# Patient Record
Sex: Male | Born: 1965 | Race: White | Hispanic: No | Marital: Married | Smoking: Never smoker
Health system: Southern US, Community
[De-identification: ages and names within clinical notes are randomized; demographics above are authoritative.]

## PROBLEM LIST (undated history)

## (undated) DIAGNOSIS — Z8249 Family history of ischemic heart disease and other diseases of the circulatory system: Secondary | ICD-10-CM

## (undated) DIAGNOSIS — K219 Gastro-esophageal reflux disease without esophagitis: Secondary | ICD-10-CM

## (undated) DIAGNOSIS — I2510 Atherosclerotic heart disease of native coronary artery without angina pectoris: Secondary | ICD-10-CM

## (undated) DIAGNOSIS — E785 Hyperlipidemia, unspecified: Secondary | ICD-10-CM

## (undated) DIAGNOSIS — M79602 Pain in left arm: Secondary | ICD-10-CM

## (undated) DIAGNOSIS — I770 Arteriovenous fistula, acquired: Secondary | ICD-10-CM

## (undated) DIAGNOSIS — R9431 Abnormal electrocardiogram [ECG] [EKG]: Secondary | ICD-10-CM

## (undated) HISTORY — DX: Abnormal electrocardiogram (ECG) (EKG): R94.31

## (undated) HISTORY — DX: Family history of ischemic heart disease and other diseases of the circulatory system: Z82.49

## (undated) HISTORY — PX: ESOPHAGEAL DILATION: SHX303

## (undated) HISTORY — DX: Hyperlipidemia, unspecified: E78.5

## (undated) HISTORY — DX: Pain in left arm: M79.602

---

## 2005-08-19 ENCOUNTER — Encounter: Admission: RE | Admit: 2005-08-19 | Discharge: 2005-08-19 | Payer: Self-pay | Admitting: Internal Medicine

## 2011-10-20 ENCOUNTER — Other Ambulatory Visit: Payer: Self-pay | Admitting: Gastroenterology

## 2011-10-20 ENCOUNTER — Ambulatory Visit
Admission: RE | Admit: 2011-10-20 | Discharge: 2011-10-20 | Disposition: A | Discharge status: Home / Self Care | Payer: BC Managed Care – PPO | Source: Ambulatory Visit | Attending: Gastroenterology | Admitting: Gastroenterology

## 2011-10-20 DIAGNOSIS — R1310 Dysphagia, unspecified: Secondary | ICD-10-CM

## 2013-06-29 ENCOUNTER — Ambulatory Visit (INDEPENDENT_AMBULATORY_CARE_PROVIDER_SITE_OTHER): Payer: BC Managed Care – PPO | Admitting: Cardiovascular Disease

## 2013-06-29 ENCOUNTER — Encounter (HOSPITAL_COMMUNITY): Payer: Self-pay | Admitting: Pharmacy Technician

## 2013-06-29 ENCOUNTER — Encounter: Payer: Self-pay | Admitting: Cardiovascular Disease

## 2013-06-29 VITALS — BP 120/90 | Ht 73.0 in | Wt 237.7 lb

## 2013-06-29 DIAGNOSIS — R9431 Abnormal electrocardiogram [ECG] [EKG]: Secondary | ICD-10-CM

## 2013-06-29 DIAGNOSIS — Z79899 Other long term (current) drug therapy: Secondary | ICD-10-CM

## 2013-06-29 DIAGNOSIS — R5383 Other fatigue: Secondary | ICD-10-CM

## 2013-06-29 DIAGNOSIS — D689 Coagulation defect, unspecified: Secondary | ICD-10-CM

## 2013-06-29 DIAGNOSIS — R0789 Other chest pain: Secondary | ICD-10-CM

## 2013-06-29 DIAGNOSIS — R5381 Other malaise: Secondary | ICD-10-CM

## 2013-06-29 DIAGNOSIS — M79602 Pain in left arm: Secondary | ICD-10-CM | POA: Insufficient documentation

## 2013-06-29 DIAGNOSIS — Z01818 Encounter for other preprocedural examination: Secondary | ICD-10-CM

## 2013-06-29 NOTE — Patient Instructions (Signed)
Your physician has requested that you have a cardiac catheterization (radial) next week. Cardiac catheterization is used to diagnose and/or treat various heart conditions. Doctors may recommend this procedure for a number of different reasons. The most common reason is to evaluate chest pain. Chest pain can be a symptom of coronary artery disease (CAD), and cardiac catheterization can show whether plaque is narrowing or blocking your heart's arteries. This procedure is also used to evaluate the valves, as well as measure the blood flow and oxygen levels in different parts of your heart. For further information please visit [email or web address].   Following your catheterization, you will not be allowed to drive for 3 days.  No lifting, pushing, or pulling greater that 10 pounds is allowed for 1 week.  When the procedure is scheduled, you will be given a date to have bloodwork and a chest xray done.

## 2013-06-29 NOTE — Assessment & Plan Note (Signed)
The patient has developed exertional left upper extremity pain over the last month. It is fairly reproducible with exercise. He does have a strong family history of heart disease with a father who died of an MI at 6459, uncle and paternal grandfather all of who had heart disease. EKG shows Q waves V1 through V4 suggesting an anteroseptal MI. He is on low-dose aspirin. Abdomen performed diagnostic coronary arteriography on him via the right radial approach on Monday. I thoroughly explained the risks and benefits.

## 2013-06-29 NOTE — Progress Notes (Signed)
06/29/2013 [name]   07/06/1965  [id number]  Primary Physician Pearla DubonnetGATES,ROBERT NEVILL, MD Primary Cardiologist: Runell GessJonathan J. Blue Winther MD Roseanne RenoFACP,FACC,FAHA, FSCAI   HPI:  Mr. 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 He was referred by Dr. Johnella MoloneyNeville Gates for cardiovascular evaluation because of exertional left upper extremity pain and an abnormal EKG. Factors include a strong family history of heart disease the father died of an MI 4959, maternal uncle and grandfather all had heart disease. She's never had artificial. His only risk factor is family history. 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 The pain is reproducible and is without exertional. His EKG shows Q waves V1 through V4 suggesting anteroseptal myocardial function.   Current Outpatient Prescriptions  Medication Sig Dispense Refill  . aspirin 81 MG tablet Take 81 mg by mouth daily.      Marland Kitchen. omeprazole (PRILOSEC) 20 MG capsule Take 20 mg by mouth daily.      . ranitidine (ZANTAC) 150 MG tablet Take 150 mg by mouth at bedtime.       No current facility-administered medications for this visit.    Not on File  History   Social History  . Marital Status: Married    Spouse Name: N/A    Number of Children: N/A  . Years of Education: N/A   Occupational History  . Not on file.   Social History Main Topics  . Smoking status: Never Smoker   . Smokeless tobacco: Not on file  . Alcohol Use: Not on file  . Drug Use: Not on file  . Sexual Activity: Not on file   Other Topics Concern  . Not on file   Social History Narrative  . No narrative on file     Review of Systems: General: negative for chills, fever, night sweats or weight changes.  Cardiovascular: negative for chest pain, dyspnea on exertion, edema, orthopnea, palpitations, paroxysmal nocturnal dyspnea or shortness of breath Dermatological: negative  for rash Respiratory: negative for cough or wheezing Urologic: negative for hematuria Abdominal: negative for nausea, vomiting, diarrhea, bright red blood per rectum, melena, or hematemesis Neurologic: negative for visual changes, syncope, or dizziness All other systems reviewed and are otherwise negative except as noted above.    Blood pressure 120/90, height 6\' 1"  (1.854 m), weight 107.82 kg (237 lb 11.2 oz).  General appearance: alert and no distress Neck: no adenopathy, no carotid bruit, no JVD, supple, symmetrical, trachea midline and thyroid not enlarged, symmetric, no tenderness/mass/nodules Lungs: clear to auscultation bilaterally Heart: regular rate and rhythm, S1, S2 normal, no murmur, click, rub or gallop Abdomen: soft, non-tender; bowel sounds normal; no masses,  no organomegaly Extremities: extremities normal, atraumatic, no cyanosis or edema and venous stasis dermatitis noted  EKG normal sinus rhythm at 78 with septal Q waves. There are also small Q waves involving leads II, III, and F  ASSESSMENT AND PLAN:   Pain of left upper extremity The patient has developed exertional left upper extremity pain over the last month. It is fairly reproducible with exercise. He does have a strong family history of heart disease with a father who died of an MI at 4959, uncle and paternal grandfather all of who had heart disease. EKG shows Q waves V1 through V4 suggesting an anteroseptal MI. He is on low-dose aspirin. Abdomen performed diagnostic coronary arteriography on him via the right radial approach  on Monday. I thoroughly explained the risks and benefits.      Runell Gess MD FACP,FACC,FAHA, [facility] 06/29/2013 4:05 PM

## 2013-06-30 ENCOUNTER — Ambulatory Visit
Admission: RE | Admit: 2013-06-30 | Discharge: 2013-06-30 | Disposition: A | Discharge status: Home / Self Care | Payer: BC Managed Care – PPO | Source: Ambulatory Visit | Attending: Cardiovascular Disease | Admitting: Cardiovascular Disease

## 2013-06-30 ENCOUNTER — Telehealth: Payer: Self-pay | Admitting: Cardiovascular Disease

## 2013-06-30 DIAGNOSIS — R0789 Other chest pain: Secondary | ICD-10-CM

## 2013-06-30 LAB — BASIC METABOLIC PANEL
BUN: 15 mg/dL (ref 6–23)
CALCIUM: 9.8 mg/dL (ref 8.4–10.5)
CO2: 29 mEq/L (ref 19–32)
Chloride: 104 mEq/L (ref 96–112)
Creat: 1.01 mg/dL (ref 0.50–1.35)
GLUCOSE: 88 mg/dL (ref 70–99)
Potassium: 4.6 mEq/L (ref 3.5–5.3)
Sodium: 141 mEq/L (ref 135–145)

## 2013-06-30 LAB — CBC
HCT: 47.4 % (ref 39.0–52.0)
Hemoglobin: 16.9 g/dL (ref 13.0–17.0)
MCH: 30.2 pg (ref 26.0–34.0)
MCHC: 35.7 g/dL (ref 30.0–36.0)
MCV: 84.6 fL (ref 78.0–100.0)
Platelets: 157 10*3/uL (ref 150–400)
RBC: 5.6 MIL/uL (ref 4.22–5.81)
RDW: 14.7 % (ref 11.5–15.5)
WBC: 4.8 10*3/uL (ref 4.0–10.5)

## 2013-06-30 NOTE — Telephone Encounter (Signed)
Is asking for a note to take to the court to show that he is having a procedure done and needs to postpone his jury duty will come by to pick it note today  .Marland Kitchen. Please call at [phone] .Marland Kitchen. Can also leave msg with his wife .Marland Kitchen.   Thanks

## 2013-06-30 NOTE — Telephone Encounter (Signed)
Letter provided to patient

## 2013-06-30 NOTE — Telephone Encounter (Signed)
Message forwarded to Kathryn, RN to discuss w/ Dr. Berry.  

## 2013-07-01 LAB — PROTIME-INR
INR: 0.95 (ref <1.50)
PROTHROMBIN TIME: 12.6 s (ref 11.6–15.2)

## 2013-07-01 LAB — APTT: aPTT: 32 seconds (ref 24–37)

## 2013-07-01 LAB — TSH: TSH: 3.22 u[IU]/mL (ref 0.350–4.500)

## 2013-07-03 ENCOUNTER — Ambulatory Visit (HOSPITAL_COMMUNITY)
Admission: RE | Admit: 2013-07-03 | Discharge: 2013-07-04 | Disposition: A | Discharge status: Home / Self Care | Payer: BC Managed Care – PPO | Source: Ambulatory Visit | Attending: Cardiovascular Disease | Admitting: Cardiovascular Disease

## 2013-07-03 ENCOUNTER — Encounter (HOSPITAL_COMMUNITY): Payer: Self-pay | Admitting: General Practice

## 2013-07-03 ENCOUNTER — Encounter (HOSPITAL_COMMUNITY)
Admission: RE | Disposition: A | Discharge status: Home / Self Care | Payer: BC Managed Care – PPO | Source: Ambulatory Visit | Attending: Cardiovascular Disease

## 2013-07-03 DIAGNOSIS — R0989 Other specified symptoms and signs involving the circulatory and respiratory systems: Secondary | ICD-10-CM

## 2013-07-03 DIAGNOSIS — E663 Overweight: Secondary | ICD-10-CM | POA: Insufficient documentation

## 2013-07-03 DIAGNOSIS — I770 Arteriovenous fistula, acquired: Secondary | ICD-10-CM

## 2013-07-03 DIAGNOSIS — Y840 Cardiac catheterization as the cause of abnormal reaction of the patient, or of later complication, without mention of misadventure at the time of the procedure: Secondary | ICD-10-CM | POA: Insufficient documentation

## 2013-07-03 DIAGNOSIS — I200 Unstable angina: Secondary | ICD-10-CM | POA: Insufficient documentation

## 2013-07-03 DIAGNOSIS — Z01818 Encounter for other preprocedural examination: Secondary | ICD-10-CM

## 2013-07-03 DIAGNOSIS — Z8249 Family history of ischemic heart disease and other diseases of the circulatory system: Secondary | ICD-10-CM | POA: Insufficient documentation

## 2013-07-03 DIAGNOSIS — Z955 Presence of coronary angioplasty implant and graft: Secondary | ICD-10-CM

## 2013-07-03 DIAGNOSIS — S55109A Unspecified injury of radial artery at forearm level, unspecified arm, initial encounter: Secondary | ICD-10-CM | POA: Insufficient documentation

## 2013-07-03 DIAGNOSIS — Z7982 Long term (current) use of aspirin: Secondary | ICD-10-CM | POA: Insufficient documentation

## 2013-07-03 DIAGNOSIS — I2510 Atherosclerotic heart disease of native coronary artery without angina pectoris: Secondary | ICD-10-CM | POA: Insufficient documentation

## 2013-07-03 DIAGNOSIS — M79602 Pain in left arm: Secondary | ICD-10-CM

## 2013-07-03 HISTORY — DX: Arteriovenous fistula, acquired: I77.0

## 2013-07-03 HISTORY — PX: CORONARY ANGIOPLASTY: SHX604

## 2013-07-03 HISTORY — DX: Atherosclerotic heart disease of native coronary artery without angina pectoris: I25.10

## 2013-07-03 HISTORY — PX: LEFT HEART CATHETERIZATION WITH CORONARY ANGIOGRAM: SHX5451

## 2013-07-03 HISTORY — DX: Gastro-esophageal reflux disease without esophagitis: K21.9

## 2013-07-03 LAB — POCT ACTIVATED CLOTTING TIME: Activated Clotting Time: 481 seconds

## 2013-07-03 SURGERY — LEFT HEART CATHETERIZATION WITH CORONARY ANGIOGRAM
Anesthesia: LOCAL

## 2013-07-03 MED ORDER — TICAGRELOR 90 MG PO TABS
ORAL_TABLET | ORAL | Status: AC
  Filled 2013-07-03: qty 1

## 2013-07-03 MED ORDER — ASPIRIN 81 MG PO CHEW
81.0000 mg | CHEWABLE_TABLET | Freq: Every day | ORAL | Status: DC
Start: 2013-07-03 — End: 2013-07-04
  Administered 2013-07-04: 81 mg via ORAL
  Filled 2013-07-03: qty 1

## 2013-07-03 MED ORDER — DIAZEPAM 5 MG PO TABS
5.0000 mg | ORAL_TABLET | ORAL | Status: AC
  Administered 2013-07-03: 5 mg via ORAL
  Filled 2013-07-03: qty 1

## 2013-07-03 MED ORDER — SODIUM CHLORIDE 0.9 % IV SOLN
INTRAVENOUS | Status: DC
  Administered 2013-07-03: 09:00:00 via INTRAVENOUS

## 2013-07-03 MED ORDER — SODIUM CHLORIDE 0.9 % IJ SOLN: 3.0000 mL | INTRAMUSCULAR | Status: DC | PRN

## 2013-07-03 MED ORDER — MORPHINE SULFATE 2 MG/ML IJ SOLN: 2.0000 mg | INTRAMUSCULAR | Status: DC | PRN

## 2013-07-03 MED ORDER — NITROGLYCERIN 0.2 MG/ML ON CALL CATH LAB
INTRAVENOUS | Status: AC
  Filled 2013-07-03: qty 1

## 2013-07-03 MED ORDER — ASPIRIN 81 MG PO CHEW: 81.0000 mg | CHEWABLE_TABLET | ORAL | Status: DC

## 2013-07-03 MED ORDER — SODIUM CHLORIDE 0.9 % IV SOLN
0.2500 mg/kg/h | INTRAVENOUS | Status: AC
  Filled 2013-07-03: qty 250

## 2013-07-03 MED ORDER — ASPIRIN 81 MG PO TABS: 81.0000 mg | ORAL_TABLET | Freq: Every day | ORAL | Status: DC

## 2013-07-03 MED ORDER — PANTOPRAZOLE SODIUM 40 MG PO TBEC
40.0000 mg | DELAYED_RELEASE_TABLET | Freq: Every day | ORAL | Status: DC
  Administered 2013-07-04: 40 mg via ORAL
  Filled 2013-07-03: qty 1

## 2013-07-03 MED ORDER — TICAGRELOR 90 MG PO TABS
90.0000 mg | ORAL_TABLET | Freq: Two times a day (BID) | ORAL | Status: DC
  Administered 2013-07-03 – 2013-07-04 (×2): 90 mg via ORAL
  Filled 2013-07-03 (×4): qty 1

## 2013-07-03 MED ORDER — ATORVASTATIN CALCIUM 80 MG PO TABS
80.0000 mg | ORAL_TABLET | Freq: Every day | ORAL | Status: DC
  Administered 2013-07-03: 80 mg via ORAL
  Filled 2013-07-03 (×3): qty 1

## 2013-07-03 MED ORDER — LIDOCAINE HCL (PF) 1 % IJ SOLN
INTRAMUSCULAR | Status: AC
  Filled 2013-07-03: qty 30

## 2013-07-03 MED ORDER — HEPARIN (PORCINE) IN NACL 2-0.9 UNIT/ML-% IJ SOLN
INTRAMUSCULAR | Status: AC
  Filled 2013-07-03: qty 1000

## 2013-07-03 MED ORDER — HYDROCODONE-ACETAMINOPHEN 5-325 MG PO TABS
1.0000 | ORAL_TABLET | Freq: Four times a day (QID) | ORAL | Status: DC | PRN
  Administered 2013-07-03 – 2013-07-04 (×2): 1 via ORAL
  Filled 2013-07-03 (×2): qty 1

## 2013-07-03 MED ORDER — SODIUM CHLORIDE 0.9 % IV SOLN
1.7500 mg/kg/h | INTRAVENOUS | Status: DC
  Administered 2013-07-03: 1.75 mg/kg/h via INTRAVENOUS
  Filled 2013-07-03: qty 250

## 2013-07-03 MED ORDER — HEPARIN SODIUM (PORCINE) 1000 UNIT/ML IJ SOLN
INTRAMUSCULAR | Status: AC
  Filled 2013-07-03: qty 1

## 2013-07-03 MED ORDER — VERAPAMIL HCL 2.5 MG/ML IV SOLN
INTRAVENOUS | Status: AC
  Filled 2013-07-03: qty 2

## 2013-07-03 MED ORDER — SODIUM CHLORIDE 0.9 % IV SOLN: INTRAVENOUS | Status: AC

## 2013-07-03 MED ORDER — BIVALIRUDIN 250 MG IV SOLR
INTRAVENOUS | Status: AC
  Filled 2013-07-03: qty 250

## 2013-07-03 NOTE — Interval H&P Note (Signed)
Cath Lab Visit (complete for each Cath Lab visit)  Clinical Evaluation Leading to the Procedure:   ACS: no  Non-ACS:    Anginal Classification: CCS III  Anti-ischemic medical therapy: No Therapy  Non-Invasive Test Results: No non-invasive testing performed  Prior CABG: No previous CABG      History and Physical Interval Note:  07/03/2013 10:22 AM  Joseph Datesussell Cortese Jr.  has presented today for surgery, with the diagnosis of arm pain  The various methods of treatment have been discussed with the patient and family. After consideration of risks, benefits and other options for treatment, the patient has consented to  Procedure(s): LEFT HEART CATHETERIZATION WITH CORONARY ANGIOGRAM (N/A) as a surgical intervention .  The patient's history has been reviewed, patient examined, no change in status, stable for surgery.  I have reviewed the patient's chart and labs.  Questions were answered to the patient's satisfaction.     Runell GessBERRY,Joseph Valencia

## 2013-07-03 NOTE — H&P (View-Only) (Signed)
06/29/2013 [name]   07/06/1965  [id number]  Primary Physician Pearla DubonnetGATES,ROBERT NEVILL, MD Primary Cardiologist: Runell GessJonathan J. Vesper Trant MD Roseanne RenoFACP,FACC,FAHA, FSCAI   HPI:  Mr. 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 He was referred by Dr. Johnella MoloneyNeville Gates for cardiovascular evaluation because of exertional left upper extremity pain and an abnormal EKG. Factors include a strong family history of heart disease the father died of an MI 2959, maternal uncle and grandfather all had heart disease. She's never had artificial. His only risk factor is family history. 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 The pain is reproducible and is without exertional. His EKG shows Q waves V1 through V4 suggesting anteroseptal myocardial function.   Current Outpatient Prescriptions  Medication Sig Dispense Refill  . aspirin 81 MG tablet Take 81 mg by mouth daily.      Marland Kitchen. omeprazole (PRILOSEC) 20 MG capsule Take 20 mg by mouth daily.      . ranitidine (ZANTAC) 150 MG tablet Take 150 mg by mouth at bedtime.       No current facility-administered medications for this visit.    Not on File  History   Social History  . Marital Status: Married    Spouse Name: N/A    Number of Children: N/A  . Years of Education: N/A   Occupational History  . Not on file.   Social History Main Topics  . Smoking status: Never Smoker   . Smokeless tobacco: Not on file  . Alcohol Use: Not on file  . Drug Use: Not on file  . Sexual Activity: Not on file   Other Topics Concern  . Not on file   Social History Narrative  . No narrative on file     Review of Systems: General: negative for chills, fever, night sweats or weight changes.  Cardiovascular: negative for chest pain, dyspnea on exertion, edema, orthopnea, palpitations, paroxysmal nocturnal dyspnea or shortness of breath Dermatological: negative  for rash Respiratory: negative for cough or wheezing Urologic: negative for hematuria Abdominal: negative for nausea, vomiting, diarrhea, bright red blood per rectum, melena, or hematemesis Neurologic: negative for visual changes, syncope, or dizziness All other systems reviewed and are otherwise negative except as noted above.    Blood pressure 120/90, height 6\' 1"  (1.854 m), weight 107.82 kg (237 lb 11.2 oz).  General appearance: alert and no distress Neck: no adenopathy, no carotid bruit, no JVD, supple, symmetrical, trachea midline and thyroid not enlarged, symmetric, no tenderness/mass/nodules Lungs: clear to auscultation bilaterally Heart: regular rate and rhythm, S1, S2 normal, no murmur, click, rub or gallop Abdomen: soft, non-tender; bowel sounds normal; no masses,  no organomegaly Extremities: extremities normal, atraumatic, no cyanosis or edema and venous stasis dermatitis noted  EKG normal sinus rhythm at 78 with septal Q waves. There are also small Q waves involving leads II, III, and F  ASSESSMENT AND PLAN:   Pain of left upper extremity The patient has developed exertional left upper extremity pain over the last month. It is fairly reproducible with exercise. He does have a strong family history of heart disease with a father who died of an MI at 49,  uncle and paternal grandfather all of who had heart disease. EKG shows Q waves V1 through V4 suggesting an anteroseptal MI. He is on low-dose aspirin. Abdomen performed diagnostic coronary arteriography on him via the right radial approach  on Monday. I thoroughly explained the risks and benefits.      Runell Gess MD FACP,FACC,FAHA, [facility] 06/29/2013 4:05 PM

## 2013-07-03 NOTE — CV Procedure (Signed)
Joseph DatesRussell Balling Jr. is a 48 y.o. male    [id number] LOCATION:  FACILITY: [facility]  PHYSICIAN: Nanetta BattyJonathan Lauri Till, M.D. 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 He was referred by Dr. Johnella MoloneyNeville Gates for cardiovascular evaluation because of exertional left upper extremity pain and an abnormal EKG. Factors include a strong family history of heart disease the father died of an MI 5559, maternal uncle and grandfather all had heart disease. She's never had artificial. His only risk factor is family history. 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 The pain is reproducible and is without exertional. His EKG shows Q waves V1 through V4 suggesting anteroseptal myocardial function.    PROCEDURE DESCRIPTION:   The patient was brought to the second floor [facility] cath lab in the postabsorptive state. He was premedicated with Valium 5 mg by mouth. His right wristwas prepped and shaved in usual sterile fashion. Xylocaine 1% was used for local anesthesia. A 6 French sheath was inserted into the right radial artery using standard Seldinger technique.the patient received 5000 units of heparin intravenously. 5 [place]French TIG catheter and pigtail catheter were used for selective coronary angiography and left ventriculography respectively. Visipaque dye was used for the entire TE (210 cc administered to the patient). Retrograde aorta, left ventricular and pulmonary pressures were recorded.   HEMODYNAMICS:    AO SYSTOLIC/AO DIASTOLIC: 106/74   LV SYSTOLIC/LV DIASTOLIC: 114/0  ANGIOGRAPHIC RESULTS:   1. Left main; normal  2. LAD; subtotal occlusion of the proximal LAD after the first moderate-sized diagonal branch. The LAD filled via grade 2 collaterals of the  dominant RCA. First diagonal branch had an 80% proximal stenosis 3. Left circumflex; nondominant and free of significant disease.  4. Right coronary artery; comment with grade 2 collaterals right to left filling the LAD via septal perforators 5. Left ventriculography; RAO left ventriculogram was performed using  25 mL of Visipaque dye at 12 mL/second. The overall LVEF estimated  60 %  Without wall motion abnormalities  IMPRESSION:Joseph Valencia has a subtotally occluded large LAD that fills via grade 2 collaterals. This is responsible for his symptoms with EKG changes. Likely he has no wall motion normalities and he has preserved LV function. He also has a high-grade diagonal branch stenosis a moderate sized vessel. We'll proceed with attempt at PCI and stenting of his LAD plus or minus diagonal branch using drug-eluting stent, Angiomax, and Brilenta.   Procedure description: patient received 180 mg of by mouth Brilenta. Angiomax bolus was given with an ACT of 481. Using a 6 [place]French XB LAD 4 cm curved guiding catheter along with an 014 190 cm length pro water guidewire and a 2 mm x 12 mm long [employer]  predilatation was performed of the LAD. Following this a 3 mm x 16 mm long Promus drug-eluting stent was then deployed at 16 atmospheres and postdilated with a 3.25 x 12 mm long noncompliant balloon at 16 atmospheres (321 mm) resulting in reduction of a subtotally occluded vessel to 0% residual. There was TIMI-3 flow. The wire was then redirected down the first diagonal branch and predilatation was performed with the same previously used predilatation balloon. Stenting was then performed with a 2.25 mm x 12 mm long Xience  drug-eluting stent the stent at 18 atmospheres (2.46 mm) resulting in reduction of an 80% stenosis to 0% residual. The guide catheter was then removed. The sheath was removed and a T-R band was placed on the right wrist chief patent hemostasis. Angiomax was reduced to 0.25 mg per kilogram per  minute for 4 hours.  Final impression: Successful PCI and stenting of a subtotally occluded LAD and high-grade diagonal branch stenosis using drug-eluting stents. The patient will be to dual antiplatelet therapy including aspirin and Brilenta. He'll be discharged home in the morning. I will see him back in the office in one to 2 weeks.  Joseph Gess MD, [facility] 07/03/2013 11:51 AM

## 2013-07-03 NOTE — Progress Notes (Signed)
TR BAND REMOVAL  LOCATION:    right radial  DEFLATED PER PROTOCOL:    yes  TIME BAND OFF / DRESSING APPLIED:    1915   SITE UPON ARRIVAL:    Level 0  SITE AFTER BAND REMOVAL:    Level 0  REVERSE ALLEN'S TEST:     positive  CIRCULATION SENSATION AND MOVEMENT:    Within Normal Limits   yes  COMMENTS:   Tolerated procedure well

## 2013-07-04 ENCOUNTER — Other Ambulatory Visit: Payer: Self-pay | Admitting: Nurse Practitioner

## 2013-07-04 ENCOUNTER — Encounter (HOSPITAL_COMMUNITY): Payer: Self-pay | Admitting: Nurse Practitioner

## 2013-07-04 ENCOUNTER — Encounter: Payer: Self-pay | Admitting: *Deleted

## 2013-07-04 DIAGNOSIS — R0989 Other specified symptoms and signs involving the circulatory and respiratory systems: Secondary | ICD-10-CM

## 2013-07-04 DIAGNOSIS — T82898A Other specified complication of vascular prosthetic devices, implants and grafts, initial encounter: Secondary | ICD-10-CM

## 2013-07-04 DIAGNOSIS — I200 Unstable angina: Secondary | ICD-10-CM

## 2013-07-04 DIAGNOSIS — N186 End stage renal disease: Secondary | ICD-10-CM

## 2013-07-04 DIAGNOSIS — I770 Arteriovenous fistula, acquired: Secondary | ICD-10-CM

## 2013-07-04 LAB — BASIC METABOLIC PANEL
BUN: 14 mg/dL (ref 6–23)
CALCIUM: 8.9 mg/dL (ref 8.4–10.5)
CO2: 26 mEq/L (ref 19–32)
Chloride: 105 mEq/L (ref 96–112)
Creatinine, Ser: 1 mg/dL (ref 0.50–1.35)
GFR, EST NON AFRICAN AMERICAN: 87 mL/min — AB (ref >90)
GLUCOSE: 104 mg/dL — AB (ref 70–99)
POTASSIUM: 4.3 meq/L (ref 3.7–5.3)
Sodium: 143 mEq/L (ref 137–147)

## 2013-07-04 LAB — CBC
HEMATOCRIT: 42.8 % (ref 39.0–52.0)
Hemoglobin: 16.1 g/dL (ref 13.0–17.0)
MCH: 31.2 pg (ref 26.0–34.0)
MCHC: 37.6 g/dL — AB (ref 30.0–36.0)
MCV: 82.9 fL (ref 78.0–100.0)
Platelets: 130 10*3/uL — ABNORMAL LOW (ref 150–400)
RBC: 5.16 MIL/uL (ref 4.22–5.81)
RDW: 14.5 % (ref 11.5–15.5)
WBC: 5.8 10*3/uL (ref 4.0–10.5)

## 2013-07-04 MED ORDER — TICAGRELOR 90 MG PO TABS
90.0000 mg | ORAL_TABLET | Freq: Two times a day (BID) | ORAL | Status: DC
Start: 2013-07-04 — End: 2013-07-04

## 2013-07-04 MED ORDER — ATORVASTATIN CALCIUM 80 MG PO TABS: 80.0000 mg | ORAL_TABLET | Freq: Every day | ORAL | Status: DC

## 2013-07-04 MED ORDER — NITROGLYCERIN 0.4 MG SL SUBL: 0.4000 mg | SUBLINGUAL_TABLET | SUBLINGUAL | Status: AC | PRN

## 2013-07-04 MED ORDER — TICAGRELOR 90 MG PO TABS: 90.0000 mg | ORAL_TABLET | Freq: Two times a day (BID) | ORAL | Status: DC

## 2013-07-04 MED FILL — Sodium Chloride IV Soln 0.9%: INTRAVENOUS | Qty: 50 | Status: AC

## 2013-07-04 NOTE — Discharge Instructions (Signed)

## 2013-07-04 NOTE — Consult Note (Signed)
[facility] [place]  Patient name: Joseph DatesRussell Patricelli Jr. 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 is a 48 y.o. male who was admitted with a possible anteroseptal myocardial infarction. He underwent cardiac catheterization yesterday via a right radial artery approach. He had successful PTCA of the LAD. This was done with a drug-eluting stent. He was essentially ready for discharge today however on examination he was noted to have a radial bruit. 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 This reason vascular surgery was consult. He denies any pain or paresthesias in his right hand or wrist. He is right-handed. He denies any chest pain or chest pressure.  Past Medical History  Diagnosis Date  . Family history of heart disease   . Pain of left upper extremity   . Abnormal EKG   . Coronary artery disease   . GERD (gastroesophageal reflux disease)    History reviewed. No pertinent family history.  SOCIAL HISTORY: History  Substance Use Topics  . Smoking status: Never Smoker   . Smokeless tobacco: Never Used  . Alcohol Use: Yes     Comment: OCCASIONAL WINE   No Known Allergies  Current Facility-Administered Medications  Medication Dose Route Frequency Provider Last Rate Last Dose  . aspirin chewable tablet 81 mg  81 mg Oral Daily Runell GessJonathan J Berry, MD   81 mg at 07/04/13 0948  . atorvastatin (LIPITOR) tablet 80 mg  80 mg Oral q1800 Runell GessJonathan J Berry, MD   80 mg at 07/03/13 1843  . HYDROcodone-acetaminophen (NORCO/VICODIN) 5-325 MG per tablet 1 tablet  1 tablet Oral Q6H PRN Laurann Montanaayna N Dunn, PA-C   1 tablet at 07/04/13 0346  . morphine 2 MG/ML injection 2 mg  2 mg Intravenous Q1H PRN Runell GessJonathan J Berry, MD      . pantoprazole (PROTONIX) EC tablet 40 mg  40 mg Oral Daily Runell GessJonathan J Berry, MD   40 mg at 07/04/13 0948  . Ticagrelor  (BRILINTA) tablet 90 mg  90 mg Oral BID Runell GessJonathan J Berry, MD   90 mg at 07/04/13 [id number]   REVIEW OF SYSTEMS: Arly.Keller[X ] denotes positive finding; [  ] denotes negative finding CARDIOVASCULAR:  [ ]  chest pain   Arly.Keller[X ] chest pressure on admission (left arm)  [ ]  palpitations   [ ]  orthopnea   [ ]  dyspnea on exertion   [ ]  claudication   [ ]  rest pain   [ ]  DVT   [ ]  phlebitis PULMONARY:   [ ]  productive cough   [ ]  asthma   [ ]  wheezing NEUROLOGIC:   [ ]  weakness  [ ]  paresthesias  [ ]  aphasia  [ ]  amaurosis  [ ]  dizziness HEMATOLOGIC:   [ ]  bleeding problems   [ ]  clotting disorders INTEGUMENTARY:  [ ]  rashes  [ ]  ulcers CONSTITUTIONAL:  [ ]  fever   [ ]  chills  PHYSICAL EXAM: Filed Vitals:   07/03/13 2001 07/04/13 0015 07/04/13 0348 07/04/13 0841  BP: 119/72 118/78 138/82 111/80  Pulse: 92 81 84 82  Temp: 97.4 F (36.3 C) 97.5 F (36.4 C) 97.3 F (36.3 C) 97.9 F (36.6 C)  TempSrc: Oral Oral Oral Oral  Resp: 17 18 17 18   Height:      Weight:  231 lb 14.8 oz (105.2 kg)    SpO2: 99% 96% 96%  95%   Body mass index is 30.61 kg/(m^2). GENERAL: The patient is a well-nourished male, in no acute distress. The vital signs are documented above. CARDIOVASCULAR: There is a regular rate and rhythm. I do not detect carotid bruits. He has a palpable right radial pulse. There is a bruit in the right wrist. He has a palpable left radial pulse. The right hand is warm and well-perfused. PULMONARY: There is good air exchange bilaterally without wheezing or rales. ABDOMEN: Soft and non-tender with normal pitched bowel sounds.  MUSCULOSKELETAL: There are no major deformities or cyanosis. NEUROLOGIC: No focal weakness or paresthesias are detected. SKIN: There are no ulcers or rashes noted. PSYCHIATRIC: The patient has a normal affect.  DATA:  Lab Results  Component Value Date   WBC 5.8 07/04/2013   HGB 16.1 07/04/2013   HCT 42.8 07/04/2013   MCV 82.9 07/04/2013   PLT 130* 07/04/2013   Lab Results  Component  Value Date   NA 143 07/04/2013   K 4.3 07/04/2013   CL 105 07/04/2013   CO2 26 07/04/2013   Lab Results  Component Value Date   CREATININE 1.00 07/04/2013   Lab Results  Component Value Date   INR 0.95 06/29/2013   DUPLEX OF RIGHT UPPER EXTREMITY: This shows an AV fistula at the site of the right radial artery catheterization. The right radial artery distal to the fistula is patent with antegrade triphasic flow.  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 This is asymptomatic. I would agree with the plans for a follow up duplex scan in 1 week. If this remains asymptomatic, I would simply follow this at 6 month to yearly intervals with the duplex scan. Only if it became symptomatic or enlarged significantly would I consider surgical exploration and repair of the AV fistula. I will be happy to see the patient as an outpatient in the future if he becomes symptomatic or the fistula enlarges. He is okay for discharge from a vascular standpoint.  DICKSON,CHRISTOPHER S [facility] [place] Beeper: [phone]

## 2013-07-04 NOTE — Discharge Summary (Signed)
Discharge Summary   Patient ID: Joseph DatesRussell Schlichting Jr.,  MRN: [id number], DOB/AGE: 49/07/1965 49 y.o.  Admit date: 07/03/2013 Discharge date: 07/04/2013  Primary Care Provider: GATES,ROBERT NEVILL Primary Cardiologist: Erlene QuanJ. Berry, MD   Discharge Diagnoses Principal Problem:   Unstable angina  **s/p PCI/DES to the LAD this admission.  Active Problems:   CAD (coronary artery disease)   R radial A-V fistula  **Seen by Vascular Surgery with recommendation for   Allergies No Known Allergies  Procedures  Cardiac Catheterization and Percutaneous Coronary Intervention 3.9.2015  HEMODYNAMICS:     AO SYSTOLIC/AO DIASTOLIC: 106/74    LV SYSTOLIC/LV DIASTOLIC: 114/0  ANGIOGRAPHIC RESULTS:   1. Left main; normal   2. LAD; subtotal occlusion of the proximal LAD after the first moderate-sized diagonal branch. The LAD filled via grade 2 collaterals of the dominant RCA. First diagonal branch had an 80% proximal stenosis   **The LAD was successfully stented using a 3.0 x 16 mm Promus drug-eluting stent.**  3. Left circumflex; nondominant and free of significant disease.   4. Right coronary artery; comment with grade 2 collaterals right to left filling the LAD via septal perforators 5. Left ventriculography; RAO left ventriculogram was performed using   25 mL of Visipaque dye at 12 mL/second. The overall LVEF estimated   60 %  Without wall motion abnormalities _____________   Right Upper Extremity Arterial Duplex 3.10.2015  Findings suggest there is a traumatic arteriovenous fistula at the right radial artery catheterization site. The right radial artery distal to the fistula is patent with antegrade triphasic flow. _____________   History of Present Illness  48 year old male without prior cardiac history who was in his usual state of health until recently when he began to experience exertional left upper extremity discomfort. He was seen by his primary care provider and was noted to have an  abnormal ECG with poor R-wave progression  Suggesting a prior anteroseptal infarct. He was referred to cardiology and seen in clinic on March 5, and decision was made to pursue diagnostic catheterization.  Hospital Course  Patient [facility] to the [facility] cardiac catheterization laboratory on 07/03/2013 and underwent diagnostic cardiac catheterization revealing a subtotal occlusion of the proximal LAD as well as 80% proximal stenosis in the first diagonal. He otherwise had nonobstructive coronary artery disease and normal LV function. The LAD was felt to be the culprit for his symptoms and this was successfully intervened upon with placement of a 3.0 by 16mm Promus drug-eluting stent. Patient tolerated procedure well and post procedure has been ambulating without any recurrence of chest or arm discomfort. He has been placed on aspirin, statin, and brilinta therapy. On exam this morning, he was noted to have a loud bruit over his right radial arterial site. Vascular ultrasound was performed and revealed atraumatic arteriovenous fistula at the right radial artery with patent distal flow. Vascular surgery was consulted and felt that this could be managed conservatively with followup arterial duplex in 1 week, which has been arranged. Mr. Joseph Valencia has been provided with instructions to limit his activities using his right arm/wrist over the next week and will be discharged home today in good condition.  Discharge Vitals Blood pressure 111/80, pulse 82, temperature 97.9 F (36.6 C), temperature source Oral, resp. rate 18, height 6\' 1"  (1.854 m), weight 231 lb 14.8 oz (105.2 kg), SpO2 95.00%.  Filed Weights   07/03/13 0814 07/04/13 0015  Weight: 230 lb (104.327 kg) 231 lb 14.8 oz (105.2 kg)   Labs  CBC  Recent Labs  07/04/13 0623  WBC 5.8  HGB 16.1  HCT 42.8  MCV 82.9  PLT 130*   Basic Metabolic Panel  Recent Labs  07/04/13 0623  NA 143  K 4.3  CL 105  CO2 26  GLUCOSE 104*  BUN 14    CREATININE 1.00  CALCIUM 8.9   Disposition  Pt is being discharged home today in good condition.  Follow-up Plans & Appointments      Follow-up Information   Follow up with Runell Gess, MD On 07/21/2013. (9:45 AM)    Specialty:  Cardiology   Contact information:   [address] [place] [address] [phone]       Follow up with Pearla Dubonnet, MD. (as scheduled.)    Specialty:  Internal Medicine   Contact information:   [address] [place] [address] [phone]       Follow up with [facility] On 07/10/2013. (1:30 PM - for Right Wrist Ultrasound)    Contact information:   [address] [place] [address] [phone]     Discharge Medications    Medication List         aspirin 81 MG tablet  Take 81 mg by mouth daily.     atorvastatin 80 MG tablet  Commonly known as:  LIPITOR  Take 1 tablet (80 mg total) by mouth daily at 6 PM.     EQL COQ10 300 MG Caps  Generic drug:  Coenzyme Q10  Take 300 mg by mouth daily.     folic acid 400 MCG tablet  Commonly known as:  FOLVITE  Take 400 mcg by mouth daily.     GLUCOSAMINE MSM COMPLEX PO  Take 1,800 mg by mouth daily.     multivitamin with minerals tablet  Take 1 tablet by mouth daily.     nitroGLYCERIN 0.4 MG SL tablet  Commonly known as:  NITROSTAT  Place 1 tablet (0.4 mg total) under the tongue every 5 (five) minutes as needed for chest pain.     omeprazole 20 MG capsule  Commonly known as:  PRILOSEC  Take 20 mg by mouth daily.     ranitidine 150 MG tablet  Commonly known as:  ZANTAC  Take 150 mg by mouth at bedtime as needed for heartburn.     SUPER B COMPLEX PO  Take 1 tablet by mouth daily.     Ticagrelor 90 MG Tabs tablet  Commonly known as:  BRILINTA  Take 1 tablet (90 mg total) by mouth 2 (two) times daily.     vitamin B-12 1000 MCG tablet  Commonly known as:  CYANOCOBALAMIN  Take 1,000 mcg by mouth  daily.     Vitamin D3 2000 UNITS Tabs  Take 2,000 Units by mouth daily.       Outstanding Labs/Studies  Right upper extremity arterial duplex scheduled for 3/16 @ 1:30 PM. He will need followup lipids and LFTs in 6-8 weeks.  Duration of Discharge Encounter   Greater than 30 minutes including physician time.  SignedNicolasa Ducking NP 07/04/2013, 12:29 PM

## 2013-07-04 NOTE — Progress Notes (Signed)
I have seen and evaluated the patient this AM along with Nicolasa Duckinghristopher Berge NP.  I agree with his findings, examination as well as impression recommendations.  Clinically doing well.  Otherwise ready to discharge, however, on exam, there was a radial bruit.  Unusual finding.   We will order a vascular US to assess for fistula / pseudoaneurysm.   His discharge will be delayed until the US can be performed.   Marykay LexHARDING,Corrinna Karapetyan W, M.D., M.S. Interventional Cardiologist  [facility] Pager # [phone] 07/04/2013

## 2013-07-04 NOTE — Care Management Note (Signed)
    Page 1 of 1   07/04/2013     10:50:35 AM   CARE MANAGEMENT NOTE 07/04/2013  Patient:  Joseph Valencia,Joseph Valencia   Account Number:  [id number]  Date Initiated:  07/04/2013  Documentation initiated by:  [facility]utch[facility]  Subjective/Objective Assessment:   48 yo malereferred for cardiovascular evaluation because of exertional left upper extremity pain and an abnormal EKG.//Home with spouse     Action/Plan:   Procedure(s):LEFT HEART CATHETERIZATION WITH CORONARY ANGIOGRAM (N/A) as a surgical intervention.//Benefits check for Brilinta   Anticipated DC Date:  07/04/2013   Anticipated DC Plan:  HOME/SELF CARE      DC Planning Services  CM consult      Choice offered to / List presented to:             Status of service:  Completed, signed off Medicare Important Message given?   (If response is "NO", the following Medicare IM given date fields will be blank) Date Medicare IM given:   Date Additional Medicare IM given:    Discharge Disposition:    Per UR Regulation:  Reviewed for med. necessity/level of care/duration of stay  If discussed at Long Length of Stay Meetings, dates discussed:    Comments:  07/04/13 0930 Oletta Cohnamellia Zyir Gassert, RN, BSN, [employer]CM [phone] Spoke with pt at bedside regarding benefits check for Brilinta.  Pt has brochure with 30 day free card and refill assistance card intact.  [facility] utilizes [facility] [address] for prescription needs.  NCM called pharmacy to confirm availability of medication.  Information relayed to pt.  Pt verbalizes importance of filling medication upon discharge.

## 2013-07-04 NOTE — Progress Notes (Signed)
*  PRELIMINARY RESULTS* Vascular Ultrasound Right Upper Extremity Arterial Duplex has been completed.   Findings suggest there is a traumatic arteriovenous fistula at the right radial artery catheterization site. The right radial artery distal to the fistula is patent with antegrade triphasic flow.  Preliminary results discussed with Ward Givenshris Berge, NP.  07/04/2013 11:32 AM Gertie FeyMichelle Kayra Crowell, RVT, RDCS, RDMS

## 2013-07-04 NOTE — Discharge Summary (Signed)
I have seen and evaluated the patient along Mr. [name], NP. From cardiovascular standpoint he is stable for discharge. Bradycardia the findings of the fistula the radial arterial cath access site. Is not overly sensitive moly mild swelling. Appreciate vascular surgical input as to the potential concerns. Discharge followup with Dr. Allyson SabalBerry. Continue current management including dual antiplatelet therapy.  Marykay LexHARDING,Waylyn Tenbrink W, M.D., M.S. Interventional Cardiologist  [facility] Pager # [phone] 07/04/2013

## 2013-07-04 NOTE — Progress Notes (Signed)
   Patient Name: Joseph DatesRussell Jordan Jr. Date of Encounter: 07/04/2013   Principal Problem:   Unstable angina Active Problems:   CAD (coronary artery disease)   Bruit (arterial)   SUBJECTIVE  No chest pain or sob.  Ambulated this morning w/o difficulty.  CURRENT MEDS . aspirin  81 mg Oral Daily  . atorvastatin  80 mg Oral q1800  . pantoprazole  40 mg Oral Daily  . Ticagrelor  90 mg Oral BID   OBJECTIVE  Filed Vitals:   07/03/13 2001 07/04/13 0015 07/04/13 0348 07/04/13 0841  BP: 119/72 118/78 138/82 111/80  Pulse: 92 81 84 82  Temp: 97.4 F (36.3 C) 97.5 F (36.4 C) 97.3 F (36.3 C) 97.9 F (36.6 C)  TempSrc: Oral Oral Oral Oral  Resp: 17 18 17 18   Height:      Weight:  231 lb 14.8 oz (105.2 kg)    SpO2: 99% 96% 96% 95%    Intake/Output Summary (Last 24 hours) at 07/04/13 0940 Last data filed at 07/04/13 0841  Gross per 24 hour  Intake 996.45 ml  Output   1745 ml  Net -748.55 ml   Filed Weights   07/03/13 0814 07/04/13 0015  Weight: 230 lb (104.327 kg) 231 lb 14.8 oz (105.2 kg)   PHYSICAL EXAM  General: Pleasant, NAD. Neuro: Alert and oriented X 3. Moves all extremities spontaneously. Psych: Normal affect. HEENT:  Normal  Neck: Supple without bruits or JVD. Lungs:  Resp regular and unlabored, CTA. Heart: RRR no s3, s4, or murmurs. Abdomen: Soft, non-tender, non-distended, BS + x 4.  Extremities: No clubbing, cyanosis or edema. DP/PT/Radials 2+ and equal bilaterally.  R wrist tender with mild swelling and loud bruit.  No hematoma or bleeding.   Accessory Clinical Findings  CBC  Recent Labs  07/04/13 0623  WBC 5.8  HGB 16.1  HCT 42.8  MCV 82.9  PLT 130*   Basic Metabolic Panel  Recent Labs  07/04/13 0623  NA 143  K 4.3  CL 105  CO2 26  GLUCOSE 104*  BUN 14  CREATININE 1.00  CALCIUM 8.9   TELE  rsr  ECG  Rsr, 82, poor r prog, no acute st/t changes.  Radiology/Studies  Dg Chest 2 View  06/30/2013   CLINICAL DATA:  Preop for  angiogram. Left-sided arm pain. Nonsmoker. Chest discomfort.  EXAM: CHEST  2 VIEW  COMPARISON:  None.  FINDINGS: Midline trachea. Normal heart size and mediastinal contours. No pleural effusion or pneumothorax. Lower lobe predominant interstitial thickening is mild. No congestive failure. Mild scarring in the right middle lobe and possibly lingula.  IMPRESSION: No acute cardiopulmonary disease.   Electronically Signed   By: Jeronimo GreavesKyle  Talbot M.D.   On: 06/30/2013 13:28   ASSESSMENT AND PLAN  1.  USA/CAD:  S/p PCI/DES to the LAD and Diag yesterday.  No chest pain overnight.  Ambulating w/o difficulty.  Cont asa, statin, brilinta.  2.  R Radial Bruit:  Site is tender w/o significant hematoma.  Tender to touch.  Good pulse.  Will check u/s to r/o psa/avf.  3.  HL:  Statin is new.  Will need f/u lipids/lft's as outpt.  Signed, Nicolasa Duckinghristopher Jamae Tison NP

## 2013-07-04 NOTE — Progress Notes (Signed)
CARDIAC REHAB PHASE I   PRE:  Rate/Rhythm: 112 ST  Changing pants and heart rate up  BP:  Supine:   Sitting: 120/70  Standing:    SaO2:   MODE:  Ambulation: 1000 ft   POST:  Rate/Rhythm: 97SR  BP:  Supine:   Sitting: 143/84  Standing:    SaO2:  0840-0940 Pt walked 1000 ft with steady gait. No arm pain or CP. Education completed. Pt has brilinta packet. Discussed CRP 2 and pt [facility]. Will consider program.    Joseph Nuttingharlene Sianne Tejada, RN BSN  07/04/2013 9:36 AM

## 2013-07-10 ENCOUNTER — Ambulatory Visit (HOSPITAL_COMMUNITY)
Admission: RE | Admit: 2013-07-10 | Discharge: 2013-07-10 | Disposition: A | Discharge status: Home / Self Care | Payer: BC Managed Care – PPO | Source: Ambulatory Visit | Attending: Nurse Practitioner | Admitting: Nurse Practitioner

## 2013-07-10 ENCOUNTER — Other Ambulatory Visit (HOSPITAL_COMMUNITY): Payer: Self-pay | Admitting: Cardiovascular Disease

## 2013-07-10 DIAGNOSIS — I770 Arteriovenous fistula, acquired: Secondary | ICD-10-CM

## 2013-07-10 DIAGNOSIS — R0989 Other specified symptoms and signs involving the circulatory and respiratory systems: Secondary | ICD-10-CM

## 2013-07-10 NOTE — Progress Notes (Signed)
Right Upper Ext. Duplex Completed. Marilynne Halstedita Quantae Martel, BS, RDMS, RVT

## 2013-07-21 ENCOUNTER — Encounter: Payer: Self-pay | Admitting: Cardiovascular Disease

## 2013-07-21 ENCOUNTER — Ambulatory Visit (INDEPENDENT_AMBULATORY_CARE_PROVIDER_SITE_OTHER): Payer: BC Managed Care – PPO | Admitting: Cardiovascular Disease

## 2013-07-21 VITALS — BP 122/82 | HR 69 | Ht 73.0 in | Wt 231.5 lb

## 2013-07-21 DIAGNOSIS — E782 Mixed hyperlipidemia: Secondary | ICD-10-CM

## 2013-07-21 DIAGNOSIS — Z79899 Other long term (current) drug therapy: Secondary | ICD-10-CM

## 2013-07-21 DIAGNOSIS — I2510 Atherosclerotic heart disease of native coronary artery without angina pectoris: Secondary | ICD-10-CM

## 2013-07-21 DIAGNOSIS — I770 Arteriovenous fistula, acquired: Secondary | ICD-10-CM

## 2013-07-21 NOTE — Assessment & Plan Note (Signed)
Status post cardiac catheterization performed by myself with +/15 revealing short segment occlusion of the mid LAD which I was able to percutaneously repeat canalized tan to drug eluding stent. The remainder of his coronary anatomy was free of significant disease. He did have grade 2 right to left collaterals via septal perforators. The procedure was propagated by a right radial AV fistula documented by duplex ultrasound which he is asymptomatic from. There is a well improving on exam today. He no longer has exertional arm pain. He is on dual antiplatelet therapy with aspirin and Brilenta.

## 2013-07-21 NOTE — Assessment & Plan Note (Signed)
Iatrogenic right radial AV fistula secondary to recently performed cardiac catheterization documented by duplex ultrasound. He is completely asymptomatic from this. We'll continue to treat this conservatively.

## 2013-07-21 NOTE — Patient Instructions (Signed)
Your physician recommends that you return for lab work in: [facility].  Your physician recommends that you schedule a follow-up appointment in: 6 months with Dr. Allyson SabalBerry.

## 2013-07-21 NOTE — Progress Notes (Signed)
07/21/2013 Joseph Valencia.   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 He was referred by Dr. Johnella Moloney for cardiovascular evaluation because of exertional left upper extremity pain and an abnormal EKG. Factors include a strong family history of heart disease the father died of an MI 59, maternal uncle and grandfather all had heart disease. She's never had artificial. His only risk factor is family history. 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 The pain is reproducible and is without exertional. His EKG shows Q waves V1 through V4 suggesting anteroseptal myocardial function.I performed cardiac catheterization on him 07/03/13 revealing a short segment occlusion of the mid LAD with grade 2 right-to-left collaterals. I was ultimately able to percutaneously revascularize him with overlapping drug-eluting stents. He's done well postoperatively without recurrent symptoms. The procedure was compensated by an iatrogenic right radial AV fistula which was demonstrated by the ultrasound but from which he is asymptomatic.     Current Outpatient Prescriptions  Medication Sig Dispense Refill  . aspirin 81 MG tablet Take 81 mg by mouth daily.      Marland Kitchen atorvastatin (LIPITOR) 80 MG tablet Take 1 tablet (80 mg total) by mouth daily at 6 PM.  90 tablet  3  . B Complex-C (SUPER B COMPLEX PO) Take 1 tablet by mouth daily.      . Cholecalciferol (VITAMIN D3) 2000 UNITS TABS Take 2,000 Units by mouth daily.      . Coenzyme Q10 (EQL COQ10) 300 MG CAPS Take 300 mg by mouth daily.      . folic acid (FOLVITE) 400 MCG tablet Take 400 mcg by mouth daily.      Stephanie Acre (GLUCOSAMINE MSM COMPLEX PO) Take 1,800 mg by mouth daily.      .  Multiple Vitamins-Minerals (MULTIVITAMIN WITH MINERALS) tablet Take 1 tablet by mouth daily.      . nitroGLYCERIN (NITROSTAT) 0.4 MG SL tablet Place 1 tablet (0.4 mg total) under the tongue every 5 (five) minutes as needed for chest pain.  25 tablet  3  . omeprazole (PRILOSEC) 20 MG capsule Take 20 mg by mouth daily.      . ranitidine (ZANTAC) 150 MG tablet Take 150 mg by mouth at bedtime as needed for heartburn.       . Ticagrelor (BRILINTA) 90 MG TABS tablet Take 1 tablet (90 mg total) by mouth 2 (two) times daily.  270 tablet  3  . vitamin B-12 (CYANOCOBALAMIN) 1000 MCG tablet Take 1,000 mcg by mouth daily.       No current facility-administered medications for this visit.    No Known Allergies  History   Social History  . Marital Status: Married    Spouse Name: N/A    Number of Children: N/A  . Years of Education: N/A   Occupational History  . Not on file.   Social History Main Topics  . Smoking status: Never Smoker   . Smokeless tobacco: Never Used  . Alcohol Use: Yes     Comment: OCCASIONAL WINE  . Drug Use: No  . Sexual Activity: Not on file   Other Topics Concern  . Not on file   Social History Narrative  . No narrative on file     Review  of Systems: General: negative for chills, fever, night sweats or weight changes.  Cardiovascular: negative for chest pain, dyspnea on exertion, edema, orthopnea, palpitations, paroxysmal nocturnal dyspnea or shortness of breath Dermatological: negative for rash Respiratory: negative for cough or wheezing Urologic: negative for hematuria Abdominal: negative for nausea, vomiting, diarrhea, bright red blood per rectum, melena, or hematemesis Neurologic: negative for visual changes, syncope, or dizziness All other systems reviewed and are otherwise negative except as noted above.    Blood pressure 122/82, pulse 69, height 6\' 1"  (1.854 m), weight 231 lb 8 oz (105.008 kg).  General appearance: alert and no distress Neck: no  adenopathy, no carotid bruit, no JVD, supple, symmetrical, trachea midline and thyroid not enlarged, symmetric, no tenderness/mass/nodules Lungs: clear to auscultation bilaterally Heart: regular rate and rhythm, S1, S2 normal, no murmur, click, rub or gallop Extremities: venous stasis dermatitis noted  EKG not performed today  ASSESSMENT AND PLAN:   CAD (coronary artery disease) Status post cardiac catheterization performed by myself with +/15 revealing short segment occlusion of the mid LAD which I was able to percutaneously repeat canalized tan to drug eluding stent. The remainder of his coronary anatomy was free of significant disease. He did have grade 2 right to left collaterals via septal perforators. The procedure was propagated by a right radial AV fistula documented by duplex ultrasound which he is asymptomatic from. There is a well improving on exam today. He no longer has exertional arm pain. He is on dual antiplatelet therapy with aspirin and Brilenta.  A-V fistula Iatrogenic right radial AV fistula secondary to recently performed cardiac catheterization documented by duplex ultrasound. He is completely asymptomatic from this. We'll continue to treat this conservatively.      Runell GessJonathan J. Nico Syme MD FACP,FACC,FAHA, [facility] 07/21/2013 10:34 AM

## 2013-08-02 ENCOUNTER — Telehealth: Payer: Self-pay | Admitting: Cardiovascular Disease

## 2013-08-02 MED ORDER — TICAGRELOR 90 MG PO TABS: 90.0000 mg | ORAL_TABLET | Freq: Two times a day (BID) | ORAL | Status: DC

## 2013-08-02 MED ORDER — ATORVASTATIN CALCIUM 80 MG PO TABS
80.0000 mg | ORAL_TABLET | Freq: Every day | ORAL | Status: DC
Start: 2013-08-02 — End: 2013-10-19

## 2013-08-02 NOTE — Telephone Encounter (Signed)
No message -pt changed his mind

## 2013-08-02 NOTE — Telephone Encounter (Signed)
Refills done for Atorvastatin and Brilinta 90 day supply.

## 2013-08-02 NOTE — Telephone Encounter (Signed)
Is needing new prescriptions for Brilinta 90mg  tablet and Atorvastatin 80mg  [id number] [address] [phone]..he is switching from mail order. Because the mail order company lost his prescription ... Please call if you have any questions ....    Thanks

## 2013-10-10 LAB — COMPREHENSIVE METABOLIC PANEL
ALT: 28 U/L (ref 0–53)
AST: 18 U/L (ref 0–37)
Albumin: 4.6 g/dL (ref 3.5–5.2)
Alkaline Phosphatase: 53 U/L (ref 39–117)
BILIRUBIN TOTAL: 1.7 mg/dL — AB (ref 0.2–1.2)
BUN: 16 mg/dL (ref 6–23)
CHLORIDE: 107 meq/L (ref 96–112)
CO2: 26 meq/L (ref 19–32)
Calcium: 9 mg/dL (ref 8.4–10.5)
Creat: 0.93 mg/dL (ref 0.50–1.35)
GLUCOSE: 97 mg/dL (ref 70–99)
Potassium: 3.8 mEq/L (ref 3.5–5.3)
SODIUM: 141 meq/L (ref 135–145)
TOTAL PROTEIN: 6.1 g/dL (ref 6.0–8.3)

## 2013-10-10 LAB — LIPID PANEL
Cholesterol: 105 mg/dL (ref 0–200)
HDL: 42 mg/dL (ref >39)
LDL CALC: 45 mg/dL (ref 0–99)
Total CHOL/HDL Ratio: 2.5 Ratio
Triglycerides: 90 mg/dL (ref <150)
VLDL: 18 mg/dL (ref 0–40)

## 2013-10-19 ENCOUNTER — Telehealth: Payer: Self-pay | Admitting: *Deleted

## 2013-10-19 MED ORDER — ATORVASTATIN CALCIUM 80 MG PO TABS: 40.0000 mg | ORAL_TABLET | Freq: Every day | ORAL | Status: DC

## 2013-10-19 NOTE — Telephone Encounter (Signed)
Message copied by Marella BileVOGEL, Acie Custis W. on Thu Oct 19, 2013  2:52 PM ------      Message from: Runell GessBERRY, JONATHAN J      Created: Wed Oct 18, 2013 12:52 PM       Agree with decreasing lipitor from 80 to 40 and re checking ------

## 2014-01-28 ENCOUNTER — Other Ambulatory Visit (HOSPITAL_COMMUNITY): Payer: Self-pay | Admitting: Nurse Practitioner

## 2014-01-29 NOTE — Telephone Encounter (Signed)
Rx was sent to pharmacy electronically. 

## 2014-03-20 ENCOUNTER — Other Ambulatory Visit: Payer: Self-pay | Admitting: *Deleted

## 2014-03-20 ENCOUNTER — Encounter: Payer: Self-pay | Admitting: *Deleted

## 2014-03-20 MED ORDER — TICAGRELOR 90 MG PO TABS: 90.0000 mg | ORAL_TABLET | Freq: Two times a day (BID) | ORAL | Status: DC

## 2014-03-20 NOTE — Telephone Encounter (Signed)
Refill adjusted to 90 day supply

## 2014-04-05 ENCOUNTER — Encounter (HOSPITAL_COMMUNITY): Payer: Self-pay | Admitting: Cardiovascular Disease

## 2014-05-14 ENCOUNTER — Telehealth: Payer: Self-pay | Admitting: Cardiovascular Disease

## 2014-05-14 NOTE — Telephone Encounter (Signed)
Pt need dental work,need to know about his blood thinner.

## 2014-05-14 NOTE — Telephone Encounter (Signed)
Returned call to patient's wife.She stated husband having dental cleaning.Stated she already spoke to dentist and he stated he can continue blood thinner for cleaning.

## 2014-08-07 ENCOUNTER — Encounter: Payer: Self-pay | Admitting: Cardiovascular Disease

## 2014-08-07 ENCOUNTER — Ambulatory Visit (INDEPENDENT_AMBULATORY_CARE_PROVIDER_SITE_OTHER): Payer: BLUE CROSS/BLUE SHIELD | Admitting: Cardiovascular Disease

## 2014-08-07 DIAGNOSIS — Z79899 Other long term (current) drug therapy: Secondary | ICD-10-CM

## 2014-08-07 DIAGNOSIS — I2510 Atherosclerotic heart disease of native coronary artery without angina pectoris: Secondary | ICD-10-CM | POA: Diagnosis not present

## 2014-08-07 DIAGNOSIS — E785 Hyperlipidemia, unspecified: Secondary | ICD-10-CM | POA: Diagnosis not present

## 2014-08-07 MED ORDER — CLOPIDOGREL BISULFATE 75 MG PO TABS: 75.0000 mg | ORAL_TABLET | Freq: Every day | ORAL | Status: DC

## 2014-08-07 NOTE — Patient Instructions (Signed)
Dr Allyson SabalBerry has ordered the following test(s) to be done: 1. Blood work (Lipid and Liver panel) - to be done FASTING 2. P2Y12 test - to be done in 2 WEEKS at the HOSPITAL. Go to the main entrance and they will direct you to the lab.  Your physician has recommended making the following medication changes: STOP BRILINTA START Clopidogrel (Plavix) 75 mg - take 1 tablet daily. A prescription has been sent to your pharmacy.  Dr Allyson SabalBerry wants you to follow-up in 1 year. You will receive a reminder letter in the mail one months in advance. If you don't receive a letter, please call our office to schedule the follow-up appointment.

## 2014-08-07 NOTE — Assessment & Plan Note (Signed)
History of coronary artery disease status post PCI and stenting of a short segment occlusion mid LAD using overlapping drug-eluting stents in March of last year. He is on dual antiplatelet therapy including Brilenta. His symptoms completely resolved. He does walk 3-7 miles a day with his wife. He complains of excessive bruising. Since it's been over a year since being on Brilenta I'm going to transition him to Plavix and obtain a P2Y12  Verify now  test in 2 weeks to demonstrate efficacy.

## 2014-08-07 NOTE — Progress Notes (Signed)
08/07/2014 Joseph Datesussell Pelaez Jr.   03/25/1966  [id number]  Primary Physician Joseph Valencia,Joseph NEVILL, Valencia Primary Cardiologist: Joseph GessJonathan J. Addis Tuohy Valencia Joseph Valencia,Joseph Valencia,Joseph Valencia, Joseph Valencia   HPI:  Mr. 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 I last saw him one year ago. He was referred by Dr. Johnella MoloneyNeville Valencia for cardiovascular evaluation because of exertional left upper extremity pain and an abnormal EKG. Factors include a strong family history of heart disease the father died of an MI 2259, maternal uncle and grandfather all had heart disease. He has never had artificial. His only risk factor is family history. 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 The pain is reproducible and is without exertional. His EKG shows Q waves V1 through V4 suggesting anteroseptal myocardial function.I performed cardiac catheterization on him 07/03/13 revealing a short segment occlusion of the mid LAD with grade 2 right-to-left collaterals. I was ultimately able to percutaneously revascularize him with overlapping drug-eluting stents. He's done well postoperatively without recurrent symptoms. The procedure was complicated by an iatrogenic right radial AV fistula which was demonstrated by ultrasound but from which he is asymptomatic. He walks 3-7 miles a day with his wife without symptoms. He does complain of excessive bruising probably related to his dual antiplatelet therapy on Brilenta.   Current Outpatient Prescriptions  Medication Sig Dispense Refill  . aspirin 81 MG tablet Take 81 mg by mouth daily.    Marland Kitchen. atorvastatin (LIPITOR) 80 MG tablet Take 0.5 tablets (40 mg total) by mouth daily at 6 PM. 90 tablet 3  . B Complex-C (SUPER B COMPLEX PO) Take 1 tablet by mouth daily.    . Cholecalciferol (VITAMIN D3) 2000 UNITS TABS Take 2,000 Units by mouth daily.    . Coenzyme Q10 (EQL COQ10) 300 MG CAPS Take 300 mg by mouth daily.    .  folic acid (FOLVITE) 400 MCG tablet Take 400 mcg by mouth daily.    Joseph Valencia. Glucos-MSM-C-Mn-Ginger-Willow (GLUCOSAMINE MSM COMPLEX PO) Take 1,800 mg by mouth daily.    . Multiple Vitamins-Minerals (MULTIVITAMIN WITH MINERALS) tablet Take 1 tablet by mouth daily.    . nitroGLYCERIN (NITROSTAT) 0.4 MG SL tablet Place 1 tablet (0.4 mg total) under the tongue every 5 (five) minutes as needed for chest pain. 25 tablet 3  . omeprazole (PRILOSEC) 20 MG capsule Take 20 mg by mouth daily.    . ranitidine (ZANTAC) 150 MG tablet Take 150 mg by mouth at bedtime as needed for heartburn.     . vitamin B-12 (CYANOCOBALAMIN) 1000 MCG tablet Take 1,000 mcg by mouth daily.     No current facility-administered medications for this visit.    No Known Allergies  History   Social History  . Marital Status: Married    Spouse Name: N/A  . Number of Children: N/A  . Years of Education: N/A   Occupational History  . Not on file.   Social History Main Topics  . Smoking status: Never Smoker   . Smokeless tobacco: Never Used  . Alcohol Use: Yes     Comment: OCCASIONAL WINE  . Drug Use: No  . Sexual Activity: Not on file   Other Topics Concern  . Not on file   Social History Narrative     Review of Systems: General: negative for chills, fever, night sweats or weight changes.  Cardiovascular: negative for chest pain, dyspnea on exertion, edema, orthopnea, palpitations, paroxysmal  nocturnal dyspnea or shortness of breath Dermatological: negative for rash Respiratory: negative for cough or wheezing Urologic: negative for hematuria Abdominal: negative for nausea, vomiting, diarrhea, bright red blood per rectum, melena, or hematemesis Neurologic: negative for visual changes, syncope, or dizziness All other systems reviewed and are otherwise negative except as noted above.    Blood pressure 124/62, pulse 79, height  (1.854 m), weight 230 lb 8 oz (104.554 kg).  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 I personally reviewed this EKG  ASSESSMENT AND PLAN:   CAD (coronary artery disease) History of coronary artery disease status post PCI and stenting of a short segment occlusion mid LAD using overlapping drug-eluting stents in March of last year. He is on dual antiplatelet therapy including Brilenta. His symptoms completely resolved. He does walk 3-7 miles a day with his wife. He complains of excessive bruising. Since it's been over a year since being on Brilenta I'm going to transition him to Plavix and obtain a P2Y12  Verify now  test in 2 weeks to demonstrate efficacy.   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 I can barely hear a bruit today and there is no thrill. He is a symptomatically from this.   Hyperlipidemia History of hyperlipidemia on atorvastatin 80 mg a day. We will recheck a lipid and liver profile       Joseph Gess Valencia [facility], [facility] 08/07/2014 8:38 AM

## 2014-08-07 NOTE — Assessment & Plan Note (Signed)
History of hyperlipidemia on atorvastatin 80 mg a day. We will recheck a lipid and liver profile 

## 2014-08-07 NOTE — Assessment & Plan Note (Signed)
History of right radial AV fistula post intervention a year ago. I can barely hear a bruit today and there is no thrill. He is a symptomatically from this.

## 2014-08-11 LAB — HEPATIC FUNCTION PANEL
ALT: 23 U/L (ref 0–53)
AST: 18 U/L (ref 0–37)
Albumin: 4.5 g/dL (ref 3.5–5.2)
Alkaline Phosphatase: 55 U/L (ref 39–117)
BILIRUBIN TOTAL: 1.7 mg/dL — AB (ref 0.2–1.2)
Bilirubin, Direct: 0.3 mg/dL (ref 0.0–0.3)
Indirect Bilirubin: 1.4 mg/dL — ABNORMAL HIGH (ref 0.2–1.2)
TOTAL PROTEIN: 6.5 g/dL (ref 6.0–8.3)

## 2014-08-11 LAB — LIPID PANEL
Cholesterol: 131 mg/dL (ref 0–200)
HDL: 49 mg/dL (ref >=40)
LDL CALC: 64 mg/dL (ref 0–99)
Total CHOL/HDL Ratio: 2.7 Ratio
Triglycerides: 89 mg/dL (ref <150)
VLDL: 18 mg/dL (ref 0–40)

## 2014-08-27 ENCOUNTER — Other Ambulatory Visit (HOSPITAL_COMMUNITY)
Admission: RE | Admit: 2014-08-27 | Discharge: 2014-08-27 | Disposition: A | Discharge status: Home / Self Care | Payer: BLUE CROSS/BLUE SHIELD | Source: Ambulatory Visit | Attending: Cardiovascular Disease | Admitting: Cardiovascular Disease

## 2014-08-27 DIAGNOSIS — Z955 Presence of coronary angioplasty implant and graft: Secondary | ICD-10-CM | POA: Diagnosis not present

## 2014-08-27 DIAGNOSIS — Z7902 Long term (current) use of antithrombotics/antiplatelets: Secondary | ICD-10-CM | POA: Diagnosis not present

## 2014-08-27 DIAGNOSIS — I2510 Atherosclerotic heart disease of native coronary artery without angina pectoris: Secondary | ICD-10-CM | POA: Diagnosis not present

## 2014-08-27 LAB — PLATELET INHIBITION P2Y12: PLATELET FUNCTION P2Y12: 44 [PRU] — AB (ref 194–418)

## 2014-09-04 ENCOUNTER — Other Ambulatory Visit: Payer: Self-pay

## 2014-09-04 MED ORDER — CLOPIDOGREL BISULFATE 75 MG PO TABS: 75.0000 mg | ORAL_TABLET | Freq: Every day | ORAL | Status: DC

## 2014-09-04 NOTE — Telephone Encounter (Signed)
Rx(s) sent to pharmacy electronically.  

## 2014-09-17 ENCOUNTER — Other Ambulatory Visit: Payer: Self-pay | Admitting: Cardiovascular Disease

## 2014-09-17 NOTE — Telephone Encounter (Signed)
Brilinta stopped in April - changed to Plavix

## 2014-10-17 ENCOUNTER — Other Ambulatory Visit: Payer: Self-pay | Admitting: Cardiovascular Disease

## 2015-03-26 ENCOUNTER — Other Ambulatory Visit: Payer: Self-pay | Admitting: Cardiovascular Disease

## 2015-03-29 ENCOUNTER — Other Ambulatory Visit: Payer: Self-pay | Admitting: Cardiovascular Disease

## 2015-04-01 NOTE — Telephone Encounter (Signed)
Rx(s) sent to pharmacy electronically.  

## 2015-07-21 ENCOUNTER — Other Ambulatory Visit: Payer: Self-pay | Admitting: Cardiovascular Disease

## 2015-07-22 NOTE — Telephone Encounter (Signed)
Rx request sent to pharmacy.  

## 2015-08-15 ENCOUNTER — Telehealth: Payer: Self-pay | Admitting: Cardiovascular Disease

## 2015-08-15 NOTE — Telephone Encounter (Signed)
Closed encounter °

## 2015-08-20 ENCOUNTER — Ambulatory Visit: Payer: BLUE CROSS/BLUE SHIELD | Admitting: Cardiovascular Disease

## 2015-09-06 ENCOUNTER — Ambulatory Visit (INDEPENDENT_AMBULATORY_CARE_PROVIDER_SITE_OTHER): Payer: BLUE CROSS/BLUE SHIELD | Admitting: Cardiovascular Disease

## 2015-09-06 ENCOUNTER — Encounter: Payer: Self-pay | Admitting: Cardiovascular Disease

## 2015-09-06 DIAGNOSIS — I2510 Atherosclerotic heart disease of native coronary artery without angina pectoris: Secondary | ICD-10-CM

## 2015-09-06 DIAGNOSIS — E785 Hyperlipidemia, unspecified: Secondary | ICD-10-CM

## 2015-09-06 DIAGNOSIS — Z79899 Other long term (current) drug therapy: Secondary | ICD-10-CM | POA: Diagnosis not present

## 2015-09-06 MED ORDER — ATORVASTATIN CALCIUM 80 MG PO TABS: ORAL_TABLET | ORAL | Status: DC

## 2015-09-06 NOTE — Assessment & Plan Note (Signed)
History of hyperlipidemia on atorvastatin 40 mg a day with recent lipid profile performed 08/10/14 revealing a total cholesterol 131, LDL 64 and HDL of 49

## 2015-09-06 NOTE — Assessment & Plan Note (Signed)
History of CAD status post cardiac catheterization by myself 07/03/13 demonstrating a short segment occlusion of the mid LAD with grade 2 right left collaterals. I was ultimately able to percutaneously revascularize him overlapping drug-eluting stents. He did well postoperatively. His symptoms resolved. 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 He walks 3-7 miles a day without symptoms and is on dual antiplatelet therapy.

## 2015-09-06 NOTE — Patient Instructions (Signed)
Medication Instructions:  Your physician recommends that you continue on your current medications as directed. Please refer to the Current Medication list given to you today.   Labwork: Your physician recommends that you return for lab work AT YOUR EARLIEST CONVENIENCE- FASTING. The lab can be found on the FIRST FLOOR of out building in [address]   Testing/Procedures: none  Follow-Up: Your physician wants you to follow-up in: 12 MONTHS WITH DR Allyson SabalBERRY. You will receive a reminder letter in the mail two months in advance. If you don't receive a letter, please call our office to schedule the follow-up appointment.   Any Other Special Instructions Will Be Listed Below (If Applicable).     If you need a refill on your cardiac medications before your next appointment, please call your pharmacy.

## 2015-09-06 NOTE — Progress Notes (Signed)
09/06/2015 [name].   08/21/1965  [id number]  Primary Physician Pearla Dubonnet, MD Primary Cardiologist: Runell Gess MD Roseanne Reno   HPI:   Mr. Joseph Valencia is a delightful 50 year old mildly overweight married Caucasian male father of a 48 year old son who recently had a child. He .works as an Patent examiner for [employer]. I last saw him 08/07/14 He was referred by Dr. Johnella Moloney for cardiovascular evaluation because of exertional left upper extremity pain and an abnormal EKG. Factors include a strong family history of heart disease the father died of an MI 76, maternal uncle and grandfather all had heart disease. He has never had artificial. His only risk factor is family history. 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 The pain is reproducible and is without exertional. His EKG shows Q waves V1 through V4 suggesting anteroseptal myocardial function.I performed cardiac catheterization on him 07/03/13 revealing a short segment occlusion of the mid LAD with grade 2 right-to-left collaterals. I was ultimately able to percutaneously revascularize him with overlapping drug-eluting stents. He's done well postoperatively without recurrent symptoms. The procedure was complicated by an iatrogenic right radial AV fistula which was demonstrated by ultrasound but from which he is asymptomatic. He walks 3-7 miles a day with his wife without symptoms. He does complain of excessive bruising probably related to his dual antiplatelet therapy on Brilenta and he was since switched to plavix.   Current Outpatient Prescriptions  Medication Sig Dispense Refill  . aspirin 81 MG tablet Take 81 mg by mouth daily.    Marland Kitchen atorvastatin (LIPITOR) 80 MG tablet TAKE 1 TABLET (80 MG TOTAL) BY MOUTH DAILY AT 6 PM. 90 tablet 3  . B Complex-C (SUPER B COMPLEX PO) Take 1 tablet by mouth daily.    . Cholecalciferol (VITAMIN D3) 2000 UNITS TABS Take 2,000 Units by mouth daily.    . clopidogrel  (PLAVIX) 75 MG tablet Take 1 tablet (75 mg total) by mouth daily. 30 tablet 4  . Coenzyme Q10 (EQL COQ10) 300 MG CAPS Take 300 mg by mouth daily.    . folic acid (FOLVITE) 400 MCG tablet Take 400 mcg by mouth daily.    Stephanie Acre (GLUCOSAMINE MSM COMPLEX PO) Take 1,800 mg by mouth daily.    . Multiple Vitamins-Minerals (MULTIVITAMIN WITH MINERALS) tablet Take 1 tablet by mouth daily.    . nitroGLYCERIN (NITROSTAT) 0.4 MG SL tablet Place 1 tablet (0.4 mg total) under the tongue every 5 (five) minutes as needed for chest pain. 25 tablet 3  . omeprazole (PRILOSEC) 20 MG capsule Take 20 mg by mouth daily.    . ranitidine (ZANTAC) 150 MG tablet Take 150 mg by mouth at bedtime as needed for heartburn.     . vitamin B-12 (CYANOCOBALAMIN) 1000 MCG tablet Take 1,000 mcg by mouth daily.     No current facility-administered medications for this visit.    No Known Allergies  Social History   Social History  . Marital Status: Married    Spouse Name: N/A  . Number of Children: N/A  . Years of Education: N/A   Occupational History  . Not on file.   Social History Main Topics  . Smoking status: Never Smoker   . Smokeless tobacco: Never Used  . Alcohol Use: Yes     Comment: OCCASIONAL WINE  . Drug Use: No  . Sexual Activity: Not on file   Other Topics Concern  . Not on file   Social History  Narrative     Review of Systems: General: negative for chills, fever, night sweats or weight changes.  Cardiovascular: negative for chest pain, dyspnea on exertion, edema, orthopnea, palpitations, paroxysmal nocturnal dyspnea or shortness of breath Dermatological: negative for rash Respiratory: negative for cough or wheezing Urologic: negative for hematuria Abdominal: negative for nausea, vomiting, diarrhea, bright red blood per rectum, melena, or hematemesis Neurologic: negative for visual changes, syncope, or dizziness All other systems reviewed and are otherwise negative  except as noted above.    Blood pressure 100/80, pulse 65, height 6\' 1"  (1.854 m), weight 229 lb 8 oz (104.101 kg).  General appearance: alert and no distress Neck: no adenopathy, no carotid bruit, no JVD, supple, symmetrical, trachea midline and thyroid not enlarged, symmetric, no tenderness/mass/nodules Lungs: clear to auscultation bilaterally Heart: regular rate and rhythm, S1, S2 normal, no murmur, click, rub or gallop Extremities: extremities normal, atraumatic, no cyanosis or edema  EKG normal sinus rhythm at 65 without ST or T-wave changes. I personally reviewed this EKG  ASSESSMENT AND PLAN:   CAD (coronary artery disease) History of CAD status post cardiac catheterization by myself 07/03/13 demonstrating a short segment occlusion of the mid LAD with grade 2 right left collaterals. I was ultimately able to percutaneously revascularize him overlapping drug-eluting stents. He did well postoperatively. His symptoms resolved. 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 He walks 3-7 miles a day without symptoms and is on dual antiplatelet therapy.  Hyperlipidemia History of hyperlipidemia on atorvastatin 40 mg a day with recent lipid profile performed 08/10/14 revealing a total cholesterol 131, LDL 64 and HDL of 49      Runell GessJonathan J. Korynne Dols MD [facility] LLCFACP,FACC,FAHA, [facility]SC[facility] 09/06/2015 11:23 AM

## 2015-09-09 DIAGNOSIS — E785 Hyperlipidemia, unspecified: Secondary | ICD-10-CM | POA: Diagnosis not present

## 2015-09-09 DIAGNOSIS — Z79899 Other long term (current) drug therapy: Secondary | ICD-10-CM | POA: Diagnosis not present

## 2015-09-09 LAB — HEPATIC FUNCTION PANEL
ALBUMIN: 4.3 g/dL (ref 3.6–5.1)
ALT: 26 U/L (ref 9–46)
AST: 19 U/L (ref 10–35)
Alkaline Phosphatase: 48 U/L (ref 40–115)
Bilirubin, Direct: 0.3 mg/dL — ABNORMAL HIGH (ref <=0.2)
Indirect Bilirubin: 0.9 mg/dL (ref 0.2–1.2)
TOTAL PROTEIN: 6 g/dL — AB (ref 6.1–8.1)
Total Bilirubin: 1.2 mg/dL (ref 0.2–1.2)

## 2015-09-09 LAB — LIPID PANEL
Cholesterol: 134 mg/dL (ref 125–200)
HDL: 45 mg/dL (ref >=40)
LDL Cholesterol: 69 mg/dL (ref <130)
TRIGLYCERIDES: 102 mg/dL (ref <150)
Total CHOL/HDL Ratio: 3 Ratio (ref <=5.0)
VLDL: 20 mg/dL (ref <30)

## 2016-01-22 DIAGNOSIS — Z0000 Encounter for general adult medical examination without abnormal findings: Secondary | ICD-10-CM | POA: Diagnosis not present

## 2016-01-22 DIAGNOSIS — M79674 Pain in right toe(s): Secondary | ICD-10-CM | POA: Diagnosis not present

## 2016-01-22 DIAGNOSIS — Z6831 Body mass index (BMI) 31.0-31.9, adult: Secondary | ICD-10-CM | POA: Diagnosis not present

## 2016-01-22 DIAGNOSIS — I2510 Atherosclerotic heart disease of native coronary artery without angina pectoris: Secondary | ICD-10-CM | POA: Diagnosis not present

## 2016-01-22 DIAGNOSIS — Z9861 Coronary angioplasty status: Secondary | ICD-10-CM | POA: Diagnosis not present

## 2016-01-22 DIAGNOSIS — E669 Obesity, unspecified: Secondary | ICD-10-CM | POA: Diagnosis not present

## 2016-01-22 DIAGNOSIS — Z79899 Other long term (current) drug therapy: Secondary | ICD-10-CM | POA: Diagnosis not present

## 2016-01-22 DIAGNOSIS — E559 Vitamin D deficiency, unspecified: Secondary | ICD-10-CM | POA: Diagnosis not present

## 2016-02-12 DIAGNOSIS — Z1212 Encounter for screening for malignant neoplasm of rectum: Secondary | ICD-10-CM | POA: Diagnosis not present

## 2016-02-12 DIAGNOSIS — Z1211 Encounter for screening for malignant neoplasm of colon: Secondary | ICD-10-CM | POA: Diagnosis not present

## 2016-04-08 ENCOUNTER — Other Ambulatory Visit: Payer: Self-pay | Admitting: Cardiovascular Disease

## 2016-09-08 ENCOUNTER — Ambulatory Visit: Payer: BLUE CROSS/BLUE SHIELD | Admitting: Cardiovascular Disease

## 2016-09-09 ENCOUNTER — Encounter: Payer: Self-pay | Admitting: Cardiovascular Disease

## 2016-09-09 ENCOUNTER — Ambulatory Visit (INDEPENDENT_AMBULATORY_CARE_PROVIDER_SITE_OTHER): Payer: BLUE CROSS/BLUE SHIELD | Admitting: Cardiovascular Disease

## 2016-09-09 VITALS — BP 106/72 | HR 73 | Ht 73.0 in | Wt 232.6 lb

## 2016-09-09 DIAGNOSIS — I2510 Atherosclerotic heart disease of native coronary artery without angina pectoris: Secondary | ICD-10-CM | POA: Diagnosis not present

## 2016-09-09 DIAGNOSIS — I2583 Coronary atherosclerosis due to lipid rich plaque: Secondary | ICD-10-CM | POA: Diagnosis not present

## 2016-09-09 DIAGNOSIS — E7800 Pure hypercholesterolemia, unspecified: Secondary | ICD-10-CM

## 2016-09-09 NOTE — Patient Instructions (Signed)

## 2016-09-09 NOTE — Progress Notes (Signed)
09/09/2016 Joseph Datesussell Gorsline Jr.   11/18/1965  [id number]  Primary Physician Marden NobleGates, Robert, MD Primary Cardiologist: Runell GessJonathan J Berry MD Roseanne RenoFACP, FACC, FAHA, FSCAI  HPI:  Joseph Valencia is a delightful 51 year old mildly overweight married Caucasian male father of a 52 year old son who recently had a child. 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 Johnella MoloneyNeville Gates for cardiovascular evaluation because of exertional left upper extremity pain and an abnormal EKG. Factors include a strong family history of heart disease the father died of an MI 9559, maternal uncle and grandfather all had heart disease. He has never had artificial. His only risk factor is family history. 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 The pain is reproducible and is without exertional. His EKG shows Q waves V1 through V4 suggesting anteroseptal myocardial function.I performed cardiac catheterization on him 07/03/13 revealing a short segment occlusion of the mid LAD with grade 2 right-to-left collaterals. I was ultimately able to percutaneously revascularize him with overlapping drug-eluting stents. He's done well postoperatively without recurrent symptoms. The procedure was complicated by an iatrogenic right radial AV fistula which was demonstrated by ultrasound but from which he is asymptomatic. He walks 3-7 miles a day with his wife without symptoms. He does complain of excessive bruising probably related to his dual antiplatelet therapy on Brilenta and he was since switched to plavix. Since I saw him a year ago he remained completely asymptomatic.   Current Outpatient Prescriptions  Medication Sig Dispense Refill  . aspirin 81 MG tablet Take 81 mg by mouth daily.    Marland Kitchen. atorvastatin (LIPITOR) 80 MG tablet TAKE 1/2 TABLET (40 MG TOTAL) BY MOUTH DAILY AT 6 PM. 90 tablet 3  . Cholecalciferol (VITAMIN D3) 2000 UNITS TABS Take 2,000 Units by mouth daily.    . clopidogrel (PLAVIX) 75 MG  tablet TAKE 1 TABLET (75 MG TOTAL) BY MOUTH DAILY. 30 tablet 6  . Coenzyme Q10 (EQL COQ10) 400 MG CAPS Take 400 mg by mouth daily.     . folic acid (FOLVITE) 400 MCG tablet Take 400 mcg by mouth daily.    Stephanie Acre. Glucos-MSM-C-Mn-Ginger-Willow (GLUCOSAMINE MSM COMPLEX PO) Take 1,800 mg by mouth daily.    . Multiple Vitamins-Minerals (MULTIVITAMIN WITH MINERALS) tablet Take 1 tablet by mouth daily.    . nitroGLYCERIN (NITROSTAT) 0.4 MG SL tablet Place 1 tablet (0.4 mg total) under the tongue every 5 (five) minutes as needed for chest pain. 25 tablet 3  . omeprazole (PRILOSEC) 20 MG capsule Take 20 mg by mouth daily.    . ranitidine (ZANTAC) 150 MG tablet Take 150 mg by mouth at bedtime as needed for heartburn.     . vitamin B-12 (CYANOCOBALAMIN) 1000 MCG tablet Take 1,000 mcg by mouth daily.     No current facility-administered medications for this visit.     No Known Allergies  Social History   Social History  . Marital status: Married    Spouse name: N/A  . Number of children: N/A  . Years of education: N/A   Occupational History  . Not on file.   Social History Main Topics  . Smoking status: Never Smoker  . Smokeless tobacco: Never Used  . Alcohol use Yes     Comment: OCCASIONAL WINE  . Drug use: No  . Sexual activity: Not on file   Other Topics Concern  . Not on file   Social History Narrative  . No  narrative on file     Review of Systems: General: negative for chills, fever, night sweats or weight changes.  Cardiovascular: negative for chest pain, dyspnea on exertion, edema, orthopnea, palpitations, paroxysmal nocturnal dyspnea or shortness of breath Dermatological: negative for rash Respiratory: negative for cough or wheezing Urologic: negative for hematuria Abdominal: negative for nausea, vomiting, diarrhea, bright red blood per rectum, melena, or hematemesis Neurologic: negative for visual changes, syncope, or dizziness All other systems reviewed and are otherwise  negative except as noted above.    Blood pressure 106/72, pulse 73, height 6\' 1"  (1.854 m), weight 232 lb 9.6 oz (105.5 kg).  General appearance: alert and no distress Neck: no adenopathy, no carotid bruit, no JVD, supple, symmetrical, trachea midline and thyroid not enlarged, symmetric, no tenderness/mass/nodules Lungs: clear to auscultation bilaterally Heart: regular rate and rhythm, S1, S2 normal, no murmur, click, rub or gallop Extremities: extremities normal, atraumatic, no cyanosis or edema  EKG sinus rhythm at 73 with poor R-wave progression. I personally reviewed this EKG.  ASSESSMENT AND PLAN:   CAD (coronary artery disease) History of CAD status post cardiac catheterization by myself 07/04/11 revealing a short segment mid LAD occlusion with grade 2 right left collaterals. I was able to percutaneously revascularize this with overlapping drug-eluting stents. Postop course was uneventful. He exercises frequently and denies symptoms. He remains on dual antiplatelet therapy.  Hyperlipidemia History of hyperlipidemia on statin therapy with lipid profile performed one year ago revealing total cholesterol of 134, LDL 69 and HDL 45. This is checked by his PCP on an annual basis.      Runell Gess MD FACP,FACC,FAHA, [facility] 09/09/2016 9:51 AM

## 2016-09-09 NOTE — Assessment & Plan Note (Signed)
History of CAD status post cardiac catheterization by myself 07/04/11 revealing a short segment mid LAD occlusion with grade 2 right left collaterals. I was able to percutaneously revascularize this with overlapping drug-eluting stents. Postop course was uneventful. He exercises frequently and denies symptoms. He remains on dual antiplatelet therapy.

## 2016-09-09 NOTE — Assessment & Plan Note (Signed)
History of hyperlipidemia on statin therapy with lipid profile performed one year ago revealing total cholesterol of 134, LDL 69 and HDL 45. This is checked by his PCP on an annual basis.

## 2016-09-16 ENCOUNTER — Ambulatory Visit
Admission: RE | Admit: 2016-09-16 | Discharge: 2016-09-16 | Disposition: A | Discharge status: Home / Self Care | Payer: BLUE CROSS/BLUE SHIELD | Source: Ambulatory Visit | Attending: Internal Medicine | Admitting: Internal Medicine

## 2016-09-16 ENCOUNTER — Other Ambulatory Visit: Payer: Self-pay

## 2016-09-16 ENCOUNTER — Other Ambulatory Visit: Payer: Self-pay | Admitting: *Deleted

## 2016-09-16 ENCOUNTER — Other Ambulatory Visit: Payer: Self-pay | Admitting: Internal Medicine

## 2016-09-16 DIAGNOSIS — M25512 Pain in left shoulder: Secondary | ICD-10-CM

## 2016-09-16 DIAGNOSIS — E785 Hyperlipidemia, unspecified: Secondary | ICD-10-CM

## 2016-09-16 DIAGNOSIS — M67919 Unspecified disorder of synovium and tendon, unspecified shoulder: Secondary | ICD-10-CM | POA: Diagnosis not present

## 2016-09-16 MED ORDER — CLOPIDOGREL BISULFATE 75 MG PO TABS
75.0000 mg | ORAL_TABLET | Freq: Every day | ORAL | 3 refills | Status: DC
Start: 2016-09-16 — End: 2016-09-16

## 2016-09-16 MED ORDER — ATORVASTATIN CALCIUM 80 MG PO TABS: 40.0000 mg | ORAL_TABLET | Freq: Every day | ORAL | 3 refills | Status: DC

## 2016-09-16 MED ORDER — CLOPIDOGREL BISULFATE 75 MG PO TABS: 75.0000 mg | ORAL_TABLET | Freq: Every day | ORAL | 3 refills | Status: DC

## 2016-09-16 NOTE — Telephone Encounter (Signed)
Rx(s) sent to pharmacy electronically.  

## 2016-09-28 DIAGNOSIS — G8929 Other chronic pain: Secondary | ICD-10-CM | POA: Diagnosis not present

## 2016-09-28 DIAGNOSIS — M25512 Pain in left shoulder: Secondary | ICD-10-CM | POA: Diagnosis not present

## 2016-09-28 DIAGNOSIS — M7502 Adhesive capsulitis of left shoulder: Secondary | ICD-10-CM | POA: Diagnosis not present

## 2016-10-09 DIAGNOSIS — M7502 Adhesive capsulitis of left shoulder: Secondary | ICD-10-CM | POA: Diagnosis not present

## 2016-10-13 DIAGNOSIS — M7502 Adhesive capsulitis of left shoulder: Secondary | ICD-10-CM | POA: Diagnosis not present

## 2016-10-16 DIAGNOSIS — M7502 Adhesive capsulitis of left shoulder: Secondary | ICD-10-CM | POA: Diagnosis not present

## 2016-10-20 DIAGNOSIS — M7502 Adhesive capsulitis of left shoulder: Secondary | ICD-10-CM | POA: Diagnosis not present

## 2016-10-23 DIAGNOSIS — M7502 Adhesive capsulitis of left shoulder: Secondary | ICD-10-CM | POA: Diagnosis not present

## 2016-10-26 DIAGNOSIS — M7502 Adhesive capsulitis of left shoulder: Secondary | ICD-10-CM | POA: Diagnosis not present

## 2017-02-02 DIAGNOSIS — J309 Allergic rhinitis, unspecified: Secondary | ICD-10-CM | POA: Diagnosis not present

## 2017-02-02 DIAGNOSIS — E559 Vitamin D deficiency, unspecified: Secondary | ICD-10-CM | POA: Diagnosis not present

## 2017-02-02 DIAGNOSIS — Z79899 Other long term (current) drug therapy: Secondary | ICD-10-CM | POA: Diagnosis not present

## 2017-02-02 DIAGNOSIS — I2510 Atherosclerotic heart disease of native coronary artery without angina pectoris: Secondary | ICD-10-CM | POA: Diagnosis not present

## 2017-02-02 DIAGNOSIS — Z9861 Coronary angioplasty status: Secondary | ICD-10-CM | POA: Diagnosis not present

## 2017-02-02 DIAGNOSIS — Z0000 Encounter for general adult medical examination without abnormal findings: Secondary | ICD-10-CM | POA: Diagnosis not present

## 2017-02-02 DIAGNOSIS — R238 Other skin changes: Secondary | ICD-10-CM | POA: Diagnosis not present

## 2017-02-02 DIAGNOSIS — Z125 Encounter for screening for malignant neoplasm of prostate: Secondary | ICD-10-CM | POA: Diagnosis not present

## 2017-08-10 ENCOUNTER — Other Ambulatory Visit: Payer: Self-pay | Admitting: Internal Medicine

## 2017-08-10 ENCOUNTER — Ambulatory Visit
Admission: RE | Admit: 2017-08-10 | Discharge: 2017-08-10 | Disposition: A | Discharge status: Home / Self Care | Payer: BLUE CROSS/BLUE SHIELD | Source: Ambulatory Visit | Attending: Internal Medicine | Admitting: Internal Medicine

## 2017-08-10 DIAGNOSIS — J309 Allergic rhinitis, unspecified: Secondary | ICD-10-CM | POA: Diagnosis not present

## 2017-08-10 DIAGNOSIS — R05 Cough: Secondary | ICD-10-CM

## 2017-08-10 DIAGNOSIS — R053 Chronic cough: Secondary | ICD-10-CM

## 2017-08-10 DIAGNOSIS — M79674 Pain in right toe(s): Secondary | ICD-10-CM

## 2017-08-10 DIAGNOSIS — M19071 Primary osteoarthritis, right ankle and foot: Secondary | ICD-10-CM | POA: Diagnosis not present

## 2017-08-10 DIAGNOSIS — M79676 Pain in unspecified toe(s): Secondary | ICD-10-CM | POA: Diagnosis not present

## 2017-08-10 DIAGNOSIS — M25562 Pain in left knee: Secondary | ICD-10-CM | POA: Diagnosis not present

## 2017-08-27 ENCOUNTER — Ambulatory Visit
Admission: RE | Admit: 2017-08-27 | Discharge: 2017-08-27 | Disposition: A | Discharge status: Home / Self Care | Payer: BLUE CROSS/BLUE SHIELD | Source: Ambulatory Visit | Attending: Internal Medicine | Admitting: Internal Medicine

## 2017-08-27 ENCOUNTER — Other Ambulatory Visit: Payer: Self-pay | Admitting: Internal Medicine

## 2017-08-27 DIAGNOSIS — R05 Cough: Secondary | ICD-10-CM | POA: Diagnosis not present

## 2017-08-27 DIAGNOSIS — R059 Cough, unspecified: Secondary | ICD-10-CM

## 2017-08-30 DIAGNOSIS — R05 Cough: Secondary | ICD-10-CM | POA: Diagnosis not present

## 2017-09-02 ENCOUNTER — Other Ambulatory Visit (INDEPENDENT_AMBULATORY_CARE_PROVIDER_SITE_OTHER): Payer: BLUE CROSS/BLUE SHIELD

## 2017-09-02 ENCOUNTER — Ambulatory Visit (INDEPENDENT_AMBULATORY_CARE_PROVIDER_SITE_OTHER): Payer: BLUE CROSS/BLUE SHIELD | Admitting: Pulmonary Disease

## 2017-09-02 ENCOUNTER — Encounter: Payer: Self-pay | Admitting: Pulmonary Disease

## 2017-09-02 VITALS — BP 126/82 | HR 94 | Ht 72.0 in

## 2017-09-02 DIAGNOSIS — J189 Pneumonia, unspecified organism: Secondary | ICD-10-CM

## 2017-09-02 DIAGNOSIS — J181 Lobar pneumonia, unspecified organism: Secondary | ICD-10-CM

## 2017-09-02 LAB — COMPREHENSIVE METABOLIC PANEL
ALT: 25 U/L (ref 0–53)
AST: 18 U/L (ref 0–37)
Albumin: 4.5 g/dL (ref 3.5–5.2)
Alkaline Phosphatase: 47 U/L (ref 39–117)
BUN: 22 mg/dL (ref 6–23)
CALCIUM: 9.7 mg/dL (ref 8.4–10.5)
CHLORIDE: 106 meq/L (ref 96–112)
CO2: 29 meq/L (ref 19–32)
Creatinine, Ser: 1.09 mg/dL (ref 0.40–1.50)
GFR: 75.16 mL/min (ref >60.00)
Glucose, Bld: 101 mg/dL — ABNORMAL HIGH (ref 70–99)
Potassium: 3.9 mEq/L (ref 3.5–5.1)
Sodium: 142 mEq/L (ref 135–145)
Total Bilirubin: 1.6 mg/dL — ABNORMAL HIGH (ref 0.2–1.2)
Total Protein: 6.7 g/dL (ref 6.0–8.3)

## 2017-09-02 LAB — CBC
HCT: 45.3 % (ref 39.0–52.0)
Hemoglobin: 15.9 g/dL (ref 13.0–17.0)
MCHC: 35.1 g/dL (ref 30.0–36.0)
MCV: 85.6 fl (ref 78.0–100.0)
PLATELETS: 152 10*3/uL (ref 150.0–400.0)
RBC: 5.29 Mil/uL (ref 4.22–5.81)
RDW: 15.2 % (ref 11.5–15.5)
WBC: 4.4 10*3/uL (ref 4.0–10.5)

## 2017-09-02 LAB — SEDIMENTATION RATE: Sed Rate: 1 mm/hr (ref 0–20)

## 2017-09-02 NOTE — Progress Notes (Signed)
 Subjective:    Patient ID: Joseph Erdahl Jr., male    DOB: 11/20/1965, 52 y.o.   MRN: [id number]  HPI  Chief Complaint  Patient presents with  . Plumonary consult    Cough since Nov. 2018, Sob , No improvement with ABX.     52-year-old non-smoker presents for evaluation of non-resolving pneumonia. He reports onset of cough around October 2018.  This would be worse on lying down or with temperature changes.  He took cough syrup for many months, finally wife took him to PCP Dr. Gates, chest x-ray was done 4/16 which showed right middle lobe pneumonia.  He was given Levaquin for 10 days.  However he developed low-grade fever, night sweats and increased congestion with clear sputum and occasional dizzy spells after completion of antibiotics. Chest x-ray was repeated 5/3 which showed persistent right lower lobe pneumonia.  Hence the referral.  He denies weight loss, wheezing or history of asthma or other respiratory problems. 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 He was felt to have lower esophageal stricture due to GERD in 2013 and required dilatation procedure by [facility]. Reports ecchymosis on his arms and on his right calf  He denies postnasal drip or persistent reflux symptoms.  He has cut down his PPI to 3 times a week. Environment, lives with his wife, drinks alcohol 3 times a week, HVAC installed at home. 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  I personally reviewed all his x-rays including a normal x-ray from 06/2013   Past Medical History:  Diagnosis Date  . A-V fistula (HCC)    a. 3.2015 Right Radial AVF s/p Radial cath/pci.  . Abnormal EKG   . Coronary artery disease    a. 06/2013 Cath/PCI: LM nl, LAD 99p (3.0x16mm Promus DES), D1 80p, LCX nondom, nl, RCA nl, EF 60%.  . Family history of heart disease   . GERD (gastroesophageal reflux disease)   . Hyperlipidemia   . Pain of left upper extremity    Past  Surgical History:  Procedure Laterality Date  . CORONARY ANGIOPLASTY  07/03/2013   LAD & DES TO DIAGONAL BRANCH   DR  BERRY  . ESOPHAGEAL DILATION    . LEFT HEART CATHETERIZATION WITH CORONARY ANGIOGRAM N/A 07/03/2013   Procedure: LEFT HEART CATHETERIZATION WITH CORONARY ANGIOGRAM;  Surgeon: Jonathan J Berry, MD;  Location: [facility] CATH LAB;  Service: Cardiovascular;  Laterality: N/A;    No Known Allergies  Social History   Socioeconomic History  . Marital status: Married    Spouse name: Not on file  . Number of children: Not on file  . Years of education: Not on file  . Highest education level: Not on file  Occupational History  . Not on file  Social Needs  . Financial resource strain: Not on file  . Food insecurity:    Worry: Not on file    Inability: Not on file  . Transportation needs:    Medical: Not on file    Non-medical: Not on file  Tobacco Use  . Smoking status: Never Smoker  . Smokeless tobacco: Never Used  Substance and Sexual Activity  . Alcohol use: Yes    Comment: OCCASIONAL WINE  . Drug use: No  . Sexual activity: Not on file  Lifestyle  . Physical activity:    Days per week: Not on file    Minutes per session: Not on file  .   Stress: Not on file  Relationships  . Social connections:    Talks on phone: Not on file    Gets together: Not on file    Attends religious service: Not on file    Active member of club or organization: Not on file    Attends meetings of clubs or organizations: Not on file    Relationship status: Not on file  . Intimate partner violence:    Fear of current or ex partner: Not on file    Emotionally abused: Not on file    Physically abused: Not on file    Forced sexual activity: Not on file  Other Topics Concern  . Not on file  Social History Narrative  . Not on file       No family history on file.   Review of Systems  Constitutional: Positive for fever.  HENT: Positive for congestion, ear pain, postnasal drip,  rhinorrhea, sinus pressure, sneezing and sore throat.   Eyes: Positive for redness and itching.  Respiratory: Positive for cough, chest tightness, shortness of breath and wheezing.   Musculoskeletal: Positive for joint swelling.  Skin: Negative for rash.  Allergic/Immunologic: Positive for environmental allergies.  Hematological: Bruises/bleeds easily.  Psychiatric/Behavioral: The patient is nervous/anxious.        Objective:   Physical Exam  Gen. Pleasant, well-nourished, in no distress, normal affect ENT - no lesions, no post nasal drip, facial asymmetry (chronic) Neck: No JVD, no thyromegaly, no carotid bruits Lungs: no use of accessory muscles, no dullness to percussion, clear without rales or rhonchi  Cardiovascular: Rhythm regular, heart sounds  normal, no murmurs or gallops, no peripheral edema Abdomen: soft and non-tender, no hepatosplenomegaly, BS normal. Musculoskeletal: No deformities, no cyanosis or clubbing, rt calf ecchymosis with knot, tender   neuro:  alert, non focal       Assessment & Plan:

## 2017-09-02 NOTE — Patient Instructions (Signed)
Blood work today and obtain report from Dr. Kevan Ny Schedule CT chest with IV contrast-for non-resolving pneumonia  Further recommendations based on above  Okay to take cough syrup for now

## 2017-09-02 NOTE — Assessment & Plan Note (Signed)
Appears to be community acquired pneumonia, atypical features include cough lasting for about 6 months prior and persistent symptoms even after antibiotics.  Differential diagnosis includes eosinophilic pneumonia and aspiration given his prior history of lower esophageal stricture and dysphagia.  We will obtain CT chest to clarify and blood work including CBC with differential to look for eosinophilia  Okay to take cough syrup for now. Based on above we will decide about need for more antibiotics and reassess whether the wait and watch approach is appropriate

## 2017-09-02 NOTE — H&P (View-Only) (Signed)
Subjective:    Patient ID: Joseph Valencia Dates., male    DOB: 1965-09-05, 52 y.o.   MRN: [id number]  HPI  Chief Complaint  Patient presents with  . Plumonary consult    Cough since Nov. 2018, Sob , No improvement with ABX.     52 year old non-smoker presents for evaluation of non-resolving pneumonia. He reports onset of cough around October 2018.  This would be worse on lying down or with temperature changes.  He took cough syrup for many months, finally wife took him to PCP Dr. Kevan Ny, chest x-ray was done 4/16 which showed right middle lobe pneumonia.  He was given Levaquin for 10 days.  However he developed low-grade fever, night sweats and increased congestion with clear sputum and occasional dizzy spells after completion of antibiotics. Chest x-ray was repeated 5/3 which showed persistent right lower lobe pneumonia.  Hence the referral.  He denies weight loss, wheezing or history of asthma or other respiratory problems. 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 He was felt to have lower esophageal stricture due to GERD in 2013 and required dilatation procedure by [facility]. Reports ecchymosis on his arms and on his right calf  He denies postnasal drip or persistent reflux symptoms.  He has cut down his PPI to 3 times a week. Environment, lives with his wife, drinks alcohol 3 times a week, HVAC installed at home. 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  I personally reviewed all his x-rays including a normal x-ray from 06/2013   Past Medical History:  Diagnosis Date  . A-V fistula (HCC)    a. 3.2015 Right Radial AVF s/p Radial cath/pci.  . Abnormal EKG   . Coronary artery disease    a. 06/2013 Cath/PCI: LM nl, LAD 99p (3.0x70mm Promus DES), D1 80p, LCX nondom, nl, RCA nl, EF 60%.  . Family history of heart disease   . GERD (gastroesophageal reflux disease)   . Hyperlipidemia   . Pain of left upper extremity    Past  Surgical History:  Procedure Laterality Date  . CORONARY ANGIOPLASTY  07/03/2013   LAD & DES TO DIAGONAL BRANCH   DR  BERRY  . ESOPHAGEAL DILATION    . LEFT HEART CATHETERIZATION WITH CORONARY ANGIOGRAM N/A 07/03/2013   Procedure: LEFT HEART CATHETERIZATION WITH CORONARY ANGIOGRAM;  Surgeon: Runell Gess, MD;  Location: [facility] CATH LAB;  Service: Cardiovascular;  Laterality: N/A;    No Known Allergies  Social History   Socioeconomic History  . Marital status: Married    Spouse name: Not on file  . Number of children: Not on file  . Years of education: Not on file  . Highest education level: Not on file  Occupational History  . Not on file  Social Needs  . Financial resource strain: Not on file  . Food insecurity:    Worry: Not on file    Inability: Not on file  . Transportation needs:    Medical: Not on file    Non-medical: Not on file  Tobacco Use  . Smoking status: Never Smoker  . Smokeless tobacco: Never Used  Substance and Sexual Activity  . Alcohol use: Yes    Comment: OCCASIONAL WINE  . Drug use: No  . Sexual activity: Not on file  Lifestyle  . Physical activity:    Days per week: Not on file    Minutes per session: Not on file  .  Stress: Not on file  Relationships  . Social connections:    Talks on phone: Not on file    Gets together: Not on file    Attends religious service: Not on file    Active member of club or organization: Not on file    Attends meetings of clubs or organizations: Not on file    Relationship status: Not on file  . Intimate partner violence:    Fear of current or ex partner: Not on file    Emotionally abused: Not on file    Physically abused: Not on file    Forced sexual activity: Not on file  Other Topics Concern  . Not on file  Social History Narrative  . Not on file       No family history on file.   Review of Systems  Constitutional: Positive for fever.  HENT: Positive for congestion, ear pain, postnasal drip,  rhinorrhea, sinus pressure, sneezing and sore throat.   Eyes: Positive for redness and itching.  Respiratory: Positive for cough, chest tightness, shortness of breath and wheezing.   Musculoskeletal: Positive for joint swelling.  Skin: Negative for rash.  Allergic/Immunologic: Positive for environmental allergies.  Hematological: Bruises/bleeds easily.  Psychiatric/Behavioral: The patient is nervous/anxious.        Objective:   Physical Exam  Gen. Pleasant, well-nourished, in no distress, normal affect ENT - no lesions, no post nasal drip, facial asymmetry (chronic) Neck: No JVD, no thyromegaly, no carotid bruits Lungs: no use of accessory muscles, no dullness to percussion, clear without rales or rhonchi  Cardiovascular: Rhythm regular, heart sounds  normal, no murmurs or gallops, no peripheral edema Abdomen: soft and non-tender, no hepatosplenomegaly, BS normal. Musculoskeletal: No deformities, no cyanosis or clubbing, rt calf ecchymosis with knot, tender   neuro:  alert, non focal       Assessment & Plan:

## 2017-09-03 ENCOUNTER — Other Ambulatory Visit (INDEPENDENT_AMBULATORY_CARE_PROVIDER_SITE_OTHER): Payer: BLUE CROSS/BLUE SHIELD

## 2017-09-03 ENCOUNTER — Ambulatory Visit (INDEPENDENT_AMBULATORY_CARE_PROVIDER_SITE_OTHER): Payer: BLUE CROSS/BLUE SHIELD | Admitting: Cardiovascular Disease

## 2017-09-03 ENCOUNTER — Encounter: Payer: Self-pay | Admitting: Cardiovascular Disease

## 2017-09-03 VITALS — BP 121/78 | HR 74 | Ht 73.0 in | Wt 227.6 lb

## 2017-09-03 DIAGNOSIS — I2583 Coronary atherosclerosis due to lipid rich plaque: Secondary | ICD-10-CM

## 2017-09-03 DIAGNOSIS — I2510 Atherosclerotic heart disease of native coronary artery without angina pectoris: Secondary | ICD-10-CM

## 2017-09-03 DIAGNOSIS — Z0000 Encounter for general adult medical examination without abnormal findings: Secondary | ICD-10-CM

## 2017-09-03 DIAGNOSIS — E785 Hyperlipidemia, unspecified: Secondary | ICD-10-CM | POA: Diagnosis not present

## 2017-09-03 DIAGNOSIS — E7800 Pure hypercholesterolemia, unspecified: Secondary | ICD-10-CM | POA: Diagnosis not present

## 2017-09-03 LAB — CBC WITH DIFFERENTIAL/PLATELET
BASOS PCT: 0.6 % (ref 0.0–3.0)
Basophils Absolute: 0 10*3/uL (ref 0.0–0.1)
EOS PCT: 1.6 % (ref 0.0–5.0)
Eosinophils Absolute: 0.1 10*3/uL (ref 0.0–0.7)
HEMATOCRIT: 45.8 % (ref 39.0–52.0)
HEMOGLOBIN: 16.1 g/dL (ref 13.0–17.0)
LYMPHS PCT: 17.7 % (ref 12.0–46.0)
Lymphs Abs: 0.8 10*3/uL (ref 0.7–4.0)
MCHC: 35.1 g/dL (ref 30.0–36.0)
MCV: 86.5 fl (ref 78.0–100.0)
MONO ABS: 0.3 10*3/uL (ref 0.1–1.0)
Monocytes Relative: 7.8 % (ref 3.0–12.0)
Neutro Abs: 3.2 10*3/uL (ref 1.4–7.7)
Neutrophils Relative %: 72.3 % (ref 43.0–77.0)
Platelets: 148 10*3/uL — ABNORMAL LOW (ref 150.0–400.0)
RBC: 5.29 Mil/uL (ref 4.22–5.81)
RDW: 15.3 % (ref 11.5–15.5)
WBC: 4.4 10*3/uL (ref 4.0–10.5)

## 2017-09-03 MED ORDER — ATORVASTATIN CALCIUM 80 MG PO TABS: 40.0000 mg | ORAL_TABLET | Freq: Every day | ORAL | 3 refills | Status: DC

## 2017-09-03 NOTE — Assessment & Plan Note (Signed)
History of CAD status post LAD stenting for short segment occlusion 07/03/2013 with overlapping drug-eluting stents.  This is complicated by an iatrogenic right radial artery AV fistula which has since spontaneously resolved.  He is on dual antiplatelet therapy with aspirin and Plavix and has complained of excessive bruising.  I think it safe at this point to discontinue his Plavix and continue low-dose aspirin.

## 2017-09-03 NOTE — Patient Instructions (Signed)
Medication Instructions: Your physician recommends that you continue on your current medications as directed. Please refer to the Current Medication list given to you today.  Stop Plavix   Follow-Up: Your physician wants you to follow-up in: 1 year with Dr. Allyson Sabal. You will receive a reminder letter in the mail two months in advance. If you don't receive a letter, please call our office to schedule the follow-up appointment.  If you need a refill on your cardiac medications before your next appointment, please call your pharmacy.

## 2017-09-03 NOTE — Assessment & Plan Note (Signed)
History of hyperlipidemia on statin therapy. 

## 2017-09-03 NOTE — Progress Notes (Signed)
09/03/2017 Joseph Valencia.   [birth year]  [id number]  Primary Physician Joseph Noble, MD Primary Cardiologist: Joseph Gess MD Joseph Valencia, Joseph Valencia  HPI:  Joseph Valencia. is a 52 y.o.  mildly overweight married Caucasian male father of a 27 year old son who recently had a child. He .works as an Patent examiner for [employer]. I last saw him  09/09/2016 He was referred by Dr. Johnella Valencia for cardiovascular evaluation because of exertional left upper extremity pain and an abnormal EKG. Factors include a strong family history of heart disease the father died of an MI 38, maternal uncle and grandfather all had heart disease. He has never had artificial. His only risk factor is family history. 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 The pain is reproducible and is without exertional. His EKG shows Q waves V1 through V4 suggesting anteroseptal myocardial function.I performed cardiac catheterization on him 07/03/13 revealing a short segment occlusion of the mid LAD with grade 2 right-to-left collaterals. I was ultimately able to percutaneously revascularize him with overlapping drug-eluting stents. He's done well postoperatively without recurrent symptoms. The procedure was complicated by an iatrogenic right radial AV fistula which was demonstrated by ultrasound but from which he is asymptomatic. He walks 3-7 miles a day with his wife without symptoms. He does complain of excessive bruising probably related to his dual antiplatelet therapy on Brilenta and he was since switched to plavix. Since I saw him a year ago he remained completely asymptomatic. He does complain of some bruising on various aspects of his body.  He denies chest pain or shortness of breath.     Current Meds  Medication Sig  . aspirin 81 MG tablet Take 81 mg by mouth daily.  Marland Kitchen atorvastatin (LIPITOR) 80 MG tablet Take 0.5 tablets (40 mg total) by mouth daily at 6 PM.  . Cholecalciferol (VITAMIN D3) 2000 UNITS TABS  Take 2,000 Units by mouth daily.  . Coenzyme Q10 (EQL COQ10) 400 MG CAPS Take 400 mg by mouth daily.   . folic acid (FOLVITE) 400 MCG tablet Take 400 mcg by mouth daily.  Joseph Valencia (GLUCOSAMINE MSM COMPLEX PO) Take 1,800 mg by mouth daily.  . Multiple Vitamins-Minerals (MULTIVITAMIN WITH MINERALS) tablet Take 1 tablet by mouth daily.  . nitroGLYCERIN (NITROSTAT) 0.4 MG SL tablet Place 1 tablet (0.4 mg total) under the tongue every 5 (five) minutes as needed for chest pain.  Marland Kitchen omeprazole (PRILOSEC) 20 MG capsule Take 20 mg by mouth daily.  . ranitidine (ZANTAC) 150 MG tablet Take 150 mg by mouth at bedtime as needed for heartburn.   . vitamin B-12 (CYANOCOBALAMIN) 1000 MCG tablet Take 1,000 mcg by mouth daily.  . [DISCONTINUED] clopidogrel (PLAVIX) 75 MG tablet Take 1 tablet (75 mg total) by mouth daily.     No Known Allergies  Social History   Socioeconomic History  . Marital status: Married    Spouse name: Not on file  . Number of children: Not on file  . Years of education: Not on file  . Highest education level: Not on file  Occupational History  . Not on file  Social Needs  . Financial resource strain: Not on file  . Food insecurity:    Worry: Not on file    Inability: Not on file  . Transportation needs:    Medical: Not on file    Non-medical: Not on file  Tobacco Use  . Smoking status: Never Smoker  . Smokeless  tobacco: Never Used  Substance and Sexual Activity  . Alcohol use: Yes    Comment: OCCASIONAL WINE  . Drug use: No  . Sexual activity: Not on file  Lifestyle  . Physical activity:    Days per week: Not on file    Minutes per session: Not on file  . Stress: Not on file  Relationships  . Social connections:    Talks on phone: Not on file    Gets together: Not on file    Attends religious service: Not on file    Active member of club or organization: Not on file    Attends meetings of clubs or organizations: Not on file     Relationship status: Not on file  . Intimate partner violence:    Fear of current or ex partner: Not on file    Emotionally abused: Not on file    Physically abused: Not on file    Forced sexual activity: Not on file  Other Topics Concern  . Not on file  Social History Narrative  . Not on file     Review of Systems: General: negative for chills, fever, night sweats or weight changes.  Cardiovascular: negative for chest pain, dyspnea on exertion, edema, orthopnea, palpitations, paroxysmal nocturnal dyspnea or shortness of breath Dermatological: negative for rash Respiratory: negative for cough or wheezing Urologic: negative for hematuria Abdominal: negative for nausea, vomiting, diarrhea, bright red blood per rectum, melena, or hematemesis Neurologic: negative for visual changes, syncope, or dizziness All other systems reviewed and are otherwise negative except as noted above.    Blood pressure 121/78, pulse 74, height  (1.854 m), weight 227 lb 9.6 oz (103.2 kg).  General appearance: alert and no distress Neck: no adenopathy, no carotid bruit, no JVD, supple, symmetrical, trachea midline and thyroid not enlarged, symmetric, no tenderness/mass/nodules Lungs: clear to auscultation bilaterally Heart: regular rate and rhythm, S1, S2 normal, no murmur, click, rub or gallop Extremities: extremities normal, atraumatic, no cyanosis or edema Pulses: 2+ and symmetric Skin: Skin color, texture, turgor normal. No rashes or lesions Neurologic: Alert and oriented X 3, normal strength and tone. Normal symmetric reflexes. Normal coordination and gait  EKG sinus rhythm at 74 without ST or T wave changes.  I personally reviewed this EKG.  ASSESSMENT AND PLAN:   CAD (coronary artery disease) History of CAD status post LAD stenting for short segment occlusion 07/03/2013 with overlapping drug-eluting stents.  This is complicated by an iatrogenic right radial artery AV fistula which has since  spontaneously resolved.  He is on dual antiplatelet therapy with aspirin and Plavix and has complained of excessive bruising.  I think it safe at this point to discontinue his Plavix and continue low-dose aspirin.  Hyperlipidemia History of hyperlipidemia on statin therapy      Joseph Gess MD [facility], [facility] 09/03/2017 3:53 PM

## 2017-09-03 NOTE — Addendum Note (Signed)
Addended by: Evans Lance on: 09/03/2017 04:22 PM   Modules accepted: Orders

## 2017-09-06 ENCOUNTER — Ambulatory Visit (INDEPENDENT_AMBULATORY_CARE_PROVIDER_SITE_OTHER)
Admission: RE | Admit: 2017-09-06 | Discharge: 2017-09-06 | Disposition: A | Discharge status: Home / Self Care | Payer: BLUE CROSS/BLUE SHIELD | Source: Ambulatory Visit | Attending: Pulmonary Disease | Admitting: Pulmonary Disease

## 2017-09-06 DIAGNOSIS — J189 Pneumonia, unspecified organism: Secondary | ICD-10-CM

## 2017-09-06 MED ORDER — IOPAMIDOL (ISOVUE-300) INJECTION 61%
80.0000 mL | Freq: Once | INTRAVENOUS | Status: AC | PRN
  Administered 2017-09-06: 80 mL via INTRAVENOUS

## 2017-09-08 ENCOUNTER — Ambulatory Visit (HOSPITAL_COMMUNITY)
Admission: RE | Admit: 2017-09-08 | Discharge: 2017-09-08 | Disposition: A | Discharge status: Home / Self Care | Payer: BLUE CROSS/BLUE SHIELD | Source: Ambulatory Visit | Attending: Pulmonary Disease | Admitting: Pulmonary Disease

## 2017-09-08 ENCOUNTER — Ambulatory Visit (HOSPITAL_COMMUNITY): Payer: BLUE CROSS/BLUE SHIELD

## 2017-09-08 ENCOUNTER — Other Ambulatory Visit: Payer: Self-pay

## 2017-09-08 ENCOUNTER — Encounter (HOSPITAL_COMMUNITY)
Admission: RE | Disposition: A | Discharge status: Home / Self Care | Payer: Self-pay | Source: Ambulatory Visit | Attending: Pulmonary Disease

## 2017-09-08 DIAGNOSIS — D869 Sarcoidosis, unspecified: Secondary | ICD-10-CM | POA: Insufficient documentation

## 2017-09-08 DIAGNOSIS — Z79899 Other long term (current) drug therapy: Secondary | ICD-10-CM | POA: Insufficient documentation

## 2017-09-08 DIAGNOSIS — I252 Old myocardial infarction: Secondary | ICD-10-CM | POA: Diagnosis not present

## 2017-09-08 DIAGNOSIS — I2510 Atherosclerotic heart disease of native coronary artery without angina pectoris: Secondary | ICD-10-CM | POA: Insufficient documentation

## 2017-09-08 DIAGNOSIS — J8410 Pulmonary fibrosis, unspecified: Secondary | ICD-10-CM | POA: Insufficient documentation

## 2017-09-08 DIAGNOSIS — J181 Lobar pneumonia, unspecified organism: Secondary | ICD-10-CM | POA: Diagnosis not present

## 2017-09-08 DIAGNOSIS — J189 Pneumonia, unspecified organism: Secondary | ICD-10-CM

## 2017-09-08 DIAGNOSIS — J9811 Atelectasis: Secondary | ICD-10-CM | POA: Diagnosis not present

## 2017-09-08 DIAGNOSIS — E785 Hyperlipidemia, unspecified: Secondary | ICD-10-CM | POA: Insufficient documentation

## 2017-09-08 DIAGNOSIS — K219 Gastro-esophageal reflux disease without esophagitis: Secondary | ICD-10-CM | POA: Diagnosis not present

## 2017-09-08 DIAGNOSIS — Z9889 Other specified postprocedural states: Secondary | ICD-10-CM

## 2017-09-08 DIAGNOSIS — R848 Other abnormal findings in specimens from respiratory organs and thorax: Secondary | ICD-10-CM | POA: Diagnosis not present

## 2017-09-08 HISTORY — PX: VIDEO BRONCHOSCOPY: SHX5072

## 2017-09-08 LAB — BODY FLUID CELL COUNT WITH DIFFERENTIAL
Eos, Fluid: 2 %
Lymphs, Fluid: 41 %
Monocyte-Macrophage-Serous Fluid: 23 % — ABNORMAL LOW (ref 50–90)
Neutrophil Count, Fluid: 34 % — ABNORMAL HIGH (ref 0–25)
WBC FLUID: 22 uL (ref 0–1000)

## 2017-09-08 SURGERY — BRONCHOSCOPY, WITH FLUOROSCOPY
Anesthesia: Moderate Sedation | Laterality: Bilateral

## 2017-09-08 MED ORDER — LIDOCAINE HCL URETHRAL/MUCOSAL 2 % EX GEL
CUTANEOUS | Status: DC | PRN
  Administered 2017-09-08: 1

## 2017-09-08 MED ORDER — PHENYLEPHRINE HCL 0.25 % NA SOLN
NASAL | Status: DC | PRN
  Administered 2017-09-08: 2 via NASAL

## 2017-09-08 MED ORDER — PHENYLEPHRINE HCL 0.25 % NA SOLN: 1.0000 | Freq: Four times a day (QID) | NASAL | Status: DC | PRN

## 2017-09-08 MED ORDER — FENTANYL CITRATE (PF) 100 MCG/2ML IJ SOLN
INTRAMUSCULAR | Status: AC
  Filled 2017-09-08: qty 4

## 2017-09-08 MED ORDER — LIDOCAINE HCL 1 % IJ SOLN
INTRAMUSCULAR | Status: DC | PRN
  Administered 2017-09-08: 6 mL via RESPIRATORY_TRACT

## 2017-09-08 MED ORDER — MIDAZOLAM HCL 10 MG/2ML IJ SOLN
INTRAMUSCULAR | Status: DC | PRN
  Administered 2017-09-08 (×5): 1 mg via INTRAVENOUS

## 2017-09-08 MED ORDER — LIDOCAINE HCL 2 % EX GEL
1.0000 "application " | Freq: Once | CUTANEOUS | Status: DC
  Filled 2017-09-08: qty 5

## 2017-09-08 MED ORDER — FENTANYL CITRATE (PF) 100 MCG/2ML IJ SOLN
INTRAMUSCULAR | Status: DC | PRN
  Administered 2017-09-08 (×2): 25 ug via INTRAVENOUS
  Administered 2017-09-08 (×2): 50 ug via INTRAVENOUS

## 2017-09-08 MED ORDER — SODIUM CHLORIDE 0.9 % IV SOLN
INTRAVENOUS | Status: DC
Start: 2017-09-08 — End: 2017-09-08
  Administered 2017-09-08: 08:00:00 via INTRAVENOUS

## 2017-09-08 MED ORDER — PREDNISONE 10 MG PO TABS: 10.0000 mg | ORAL_TABLET | Freq: Two times a day (BID) | ORAL | 0 refills | Status: DC

## 2017-09-08 MED ORDER — MIDAZOLAM HCL 5 MG/ML IJ SOLN
INTRAMUSCULAR | Status: AC
  Filled 2017-09-08: qty 2

## 2017-09-08 NOTE — Interval H&P Note (Signed)
History and Physical Interval Note:  09/08/2017 8:10 AM  Joseph Valencia.  has presented today for surgery, with the diagnosis of unresolved pneumonia  The various methods of treatment have been discussed with the patient and family. After consideration of risks, benefits and other options for treatment, the patient has consented to  Procedure(s): VIDEO BRONCHOSCOPY WITH FLUORO (Bilateral) as a surgical intervention .  The patient's history has been reviewed, patient examined, no change in status, stable for surgery.  I have reviewed the patient's chart and labs.  Questions were answered to the patient's satisfaction.     [name]

## 2017-09-08 NOTE — Discharge Instructions (Signed)
Flexible Bronchoscopy, Care After These instructions give you information on caring for yourself after your procedure. Your doctor may also give you more specific instructions. Call your doctor if you have any problems or questions after your procedure. Follow these instructions at home:  Do not eat or drink anything for 2 hours after your procedure. If you try to eat or drink before the medicine wears off, food or drink could go into your lungs. You could also burn yourself.  After 2 hours have passed and when you can cough and gag normally, you may eat soft food and drink liquids slowly.  The day after the test, you may eat your normal diet.  You may do your normal activities.  Keep all doctor visits. Get help right away if:  You get more and more short of breath.  You get light-headed.  You feel like you are going to pass out (faint).  You have chest pain.  You have new problems that worry you.  You cough up more than a little blood.  You cough up more blood than before. This information is not intended to replace advice given to you by your health care provider. Make sure you discuss any questions you have with your health care provider. Document Released: 02/08/2009 Document Revised: 09/19/2015 Document Reviewed: 12/16/2012 Elsevier Interactive Patient Education  2017 [place].  Nothing to eat or drink until after 10:30 am today  09/08/2017 Any question or concerns please call the office at [phone]

## 2017-09-08 NOTE — Progress Notes (Signed)
Video Bronchoscpy  Intervention Bronchial washing Intervention Bronchial brushing Intervention Bronchial biopsy Procedure tolerated well

## 2017-09-08 NOTE — Op Note (Signed)
Indication : RML non resolving pneumonia  in this non smoker. Written informed consent was obtained prior to the procedure. The risks of the procedure including coughing, bleeding and the small chance of lung puncture requiring chest tube were discussed in great detail. The benefits & alternatives including serial follow up were also discussed.   5 mg versed & 150  mcg fentnayl used in divided doses during the procedure Total time for conscious sedation was  16 mins  [phone] [phone]  Bronchoscope entered from the left nare. Upper airway nml Vocal cords showed nml appearance & motion. Trachea & bronchial tree examined to the subsegmental level. Mild amount of clear secretions were noted. No endobronchial lesions seen. BAL was obtained from the RLL. Brushings from RML Trans bronchial biopsies x 6 were obtained from the RML & RLL  under fluoroscopy.  There was moderate coughing  during the procedure.   A CXR will be performed to r/o presence of pneumothorax.  Rigoberto Noel MD  [phone]

## 2017-09-09 ENCOUNTER — Encounter (HOSPITAL_COMMUNITY): Payer: Self-pay | Admitting: Pulmonary Disease

## 2017-09-09 ENCOUNTER — Telehealth: Payer: Self-pay | Admitting: Pulmonary Disease

## 2017-09-09 NOTE — Telephone Encounter (Signed)
Called patient to give results of the biopsy. He verbalized understanding but had a question if gargling was normal for what is going on with him and has increased since the bronch was done.   Dr. Vassie Loll please advise.

## 2017-09-10 NOTE — Telephone Encounter (Signed)
May be related to bronchoscopy and should improve

## 2017-09-10 NOTE — Telephone Encounter (Signed)
Called and spoke to patient and relayed RA's message. Patient verbalized understanding and has no questions at this time. Nothing further is needed.

## 2017-09-11 LAB — CULTURE, BAL-QUANTITATIVE W GRAM STAIN
Culture: 10000 — AB
Gram Stain: NONE SEEN

## 2017-09-11 LAB — CULTURE, BAL-QUANTITATIVE

## 2017-09-13 LAB — ACID FAST SMEAR (AFB): ACID FAST SMEAR - AFSCU2: NEGATIVE

## 2017-09-15 ENCOUNTER — Other Ambulatory Visit: Payer: BLUE CROSS/BLUE SHIELD

## 2017-09-15 ENCOUNTER — Encounter: Payer: Self-pay | Admitting: Pulmonary Disease

## 2017-09-15 ENCOUNTER — Ambulatory Visit (INDEPENDENT_AMBULATORY_CARE_PROVIDER_SITE_OTHER): Payer: BLUE CROSS/BLUE SHIELD | Admitting: Pulmonary Disease

## 2017-09-15 DIAGNOSIS — D869 Sarcoidosis, unspecified: Secondary | ICD-10-CM

## 2017-09-15 DIAGNOSIS — J181 Lobar pneumonia, unspecified organism: Secondary | ICD-10-CM | POA: Diagnosis not present

## 2017-09-15 MED ORDER — PREDNISONE 10 MG PO TABS: ORAL_TABLET | ORAL | 1 refills | Status: DC

## 2017-09-15 NOTE — Assessment & Plan Note (Signed)
Follow-up chest x-ray in 3 weeks and follow-up visit

## 2017-09-15 NOTE — Assessment & Plan Note (Signed)
Schedule PFTs. Eye exam for sarcoidosis.  Check the urine for histoplasma antigen  Increase prednisone to 40 mg for 3 days followed by 30 mg for 3 days & then back to 20 mg daily  Follow-up in 3 weeks with chest x-ray   

## 2017-09-15 NOTE — Patient Instructions (Signed)
Schedule PFTs. Eye exam for sarcoidosis.  Check the urine for histoplasma antigen  Increase prednisone to 40 mg for 3 days followed by 30 mg for 3 days & then back to 20 mg daily  Follow-up in 3 weeks with chest x-ray

## 2017-09-15 NOTE — Progress Notes (Signed)
   Subjective:    Patient ID: Joseph Bake., male    DOB: 1966-01-29, 52 y.o.   MRN: [id number]  HPI  52 year old non-smoker for FU of  of non-resolving RML pneumonia. He reported onset of cough around October 2018  He was treated with antibiotics as for community-acquired pneumonia but atypical features included prolonged duration of cough and persistent symptoms even after treatment  09/06/17 CT chest >>Mediastinal and bilateral hilar adenopathy with faint calcifications  Extensive patchy nodular thickening of the peribronchovascular interstitium with fine nodularity of the fissures and peripheral pleural surfaces in both lungs  He underwent bronchoscopy 09/08/2017 and both brushings and transbronchial biopsy showed noncaseating granulomas, AFB and fungal smears are negative  He also had skin lesions on his leg.  He was started on 20 mg prednisone and feels that his cough is 70% improved  He is tolerating this well and reports night sweats  He has lived in [place] for many years but has been living in [place] for the last 20 years  Past Medical History:  Diagnosis Date  . A-V fistula (HCC)    a. 3.2015 Right Radial AVF s/p Radial cath/pci.  . Abnormal EKG   . Coronary artery disease    a. 06/2013 Cath/PCI: LM nl, LAD 99p (3.0x55mm Promus DES), D1 80p, LCX nondom, nl, RCA nl, EF 60%.  . Family history of heart disease   . GERD (gastroesophageal reflux disease)   . Hyperlipidemia   . Pain of left upper extremity     Review of Systems neg for any significant sore throat, dysphagia, itching, sneezing, nasal congestion or excess/ purulent secretions, fever, chills, sweats, unintended wt loss, pleuritic or exertional cp, hempoptysis, orthopnea pnd or change in chronic leg swelling.   Also denies presyncope, palpitations, heartburn, abdominal pain, nausea, vomiting, diarrhea or change in bowel or urinary habits, dysuria,hematuria, rash, arthralgias, visual complaints,  headache, numbness weakness or ataxia.     Objective:   Physical Exam  Gen. Pleasant, well-nourished, in no distress ENT - no thrush, no post nasal drip Neck: No JVD, no thyromegaly, no carotid bruits Lungs: no use of accessory muscles, no dullness to percussion, clear without rales or rhonchi  Cardiovascular: Rhythm regular, heart sounds  normal, no murmurs or gallops, no peripheral edema Musculoskeletal: No deformities, no cyanosis or clubbing        Assessment & Plan:

## 2017-09-17 DIAGNOSIS — M2021 Hallux rigidus, right foot: Secondary | ICD-10-CM | POA: Diagnosis not present

## 2017-09-17 DIAGNOSIS — M79671 Pain in right foot: Secondary | ICD-10-CM | POA: Diagnosis not present

## 2017-09-20 LAB — HISTOPLASMA ANTIGEN, URINE

## 2017-09-21 ENCOUNTER — Ambulatory Visit (INDEPENDENT_AMBULATORY_CARE_PROVIDER_SITE_OTHER): Payer: BLUE CROSS/BLUE SHIELD | Admitting: Pulmonary Disease

## 2017-09-21 DIAGNOSIS — D869 Sarcoidosis, unspecified: Secondary | ICD-10-CM

## 2017-09-21 LAB — PULMONARY FUNCTION TEST
DL/VA % PRED: 98 %
DL/VA: 4.68 ml/min/mmHg/L
DLCO COR % PRED: 95 %
DLCO UNC: 35.43 ml/min/mmHg
DLCO cor: 34.07 ml/min/mmHg
DLCO unc % pred: 99 %
FEF 25-75 Post: 4.29 L/sec
FEF 25-75 Pre: 3.31 L/sec
FEF2575-%CHANGE-POST: 29 %
FEF2575-%PRED-POST: 120 %
FEF2575-%Pred-Pre: 93 %
FEV1-%CHANGE-POST: 7 %
FEV1-%PRED-POST: 104 %
FEV1-%Pred-Pre: 96 %
FEV1-PRE: 4.02 L
FEV1-Post: 4.34 L
FEV1FVC-%CHANGE-POST: 4 %
FEV1FVC-%Pred-Pre: 98 %
FEV6-%Change-Post: 3 %
FEV6-%Pred-Post: 105 %
FEV6-%Pred-Pre: 101 %
FEV6-PRE: 5.27 L
FEV6-Post: 5.47 L
FEV6FVC-%Change-Post: 0 %
FEV6FVC-%PRED-PRE: 103 %
FEV6FVC-%Pred-Post: 104 %
FVC-%CHANGE-POST: 2 %
FVC-%PRED-PRE: 98 %
FVC-%Pred-Post: 101 %
FVC-PRE: 5.31 L
FVC-Post: 5.47 L
POST FEV1/FVC RATIO: 79 %
PRE FEV6/FVC RATIO: 99 %
Post FEV6/FVC ratio: 100 %
Pre FEV1/FVC ratio: 76 %
RV % PRED: 115 %
RV: 2.58 L
TLC % pred: 107 %
TLC: 8.05 L

## 2017-09-21 NOTE — Progress Notes (Signed)
PFT done today. 

## 2017-10-05 ENCOUNTER — Other Ambulatory Visit: Payer: Self-pay | Admitting: Pulmonary Disease

## 2017-10-12 ENCOUNTER — Ambulatory Visit (INDEPENDENT_AMBULATORY_CARE_PROVIDER_SITE_OTHER): Payer: BLUE CROSS/BLUE SHIELD | Admitting: Pulmonary Disease

## 2017-10-12 ENCOUNTER — Ambulatory Visit
Admission: RE | Admit: 2017-10-12 | Discharge: 2017-10-12 | Disposition: A | Discharge status: Home / Self Care | Payer: BLUE CROSS/BLUE SHIELD | Source: Ambulatory Visit | Attending: Pulmonary Disease | Admitting: Pulmonary Disease

## 2017-10-12 ENCOUNTER — Encounter: Payer: Self-pay | Admitting: Pulmonary Disease

## 2017-10-12 VITALS — BP 130/78 | HR 111 | Ht 72.0 in | Wt 217.8 lb

## 2017-10-12 DIAGNOSIS — D869 Sarcoidosis, unspecified: Secondary | ICD-10-CM | POA: Diagnosis not present

## 2017-10-12 LAB — FUNGUS CULTURE WITH STAIN

## 2017-10-12 LAB — FUNGUS CULTURE RESULT

## 2017-10-12 LAB — FUNGAL ORGANISM REFLEX

## 2017-10-12 MED ORDER — PREDNISONE 5 MG PO TABS: 5.0000 mg | ORAL_TABLET | Freq: Every day | ORAL | 1 refills | Status: DC

## 2017-10-12 NOTE — Assessment & Plan Note (Signed)
CXR today   We can drop prednisone to 15 mg daily  On July 5, drop  to 10 mg daily and stay on this dose until next visit  2 % cortisone cream for LA to spots on legs

## 2017-10-12 NOTE — Patient Instructions (Addendum)
If chest x-ray normal. We can drop prednisone to 15 mg daily  On July 5, drop  to 10 mg daily and stay on this dose until next visit  2 % cortisone cream for LA to spots on legs

## 2017-10-12 NOTE — Progress Notes (Signed)
   Subjective:    Patient ID: Joseph Bakeussell Garrison Ionescu Jr., male    DOB: 12/22/1965, 52 y.o.   MRN: [id number]  HPI  52 year old non-smoker for FU of  of non-resolving RML pneumonia since 07/2017  He reported onset of cough around October 2018  He also had skin lesions on his leg.  He was started on 20 mg prednisone towards the end of May, he has been compliant and cough is almost completely gone. Still has spots on  back of his legs   He lived in [place] for many years but in [place]  for the last 20 years Urine histo antigen was negative  Only side effects with prednisone-night sweats &  hyperactive  Significant tests/ events reviewed  08/2017 CT chest >>Mediastinal and bilateral hilar adenopathy with faint calcifications Extensive patchy nodular thickening of the peribronchovascular interstitium with fine nodularity of the fissures and peripheral pleural surfaces in both lungs  Bronchoscopy 09/08/2017 and both brushings and transbronchial biopsy showed noncaseating granulomas, AFB and fungal smears are negative  PFTs 08/2017 nml    Review of Systems Patient denies significant dyspnea,cough, hemoptysis,  chest pain, palpitations, pedal edema, orthopnea, paroxysmal nocturnal dyspnea, lightheadedness, nausea, vomiting, abdominal or  leg pains      Objective:   Physical Exam   Gen. Pleasant, well-nourished, in no distress ENT - no thrush, no post nasal drip Neck: No JVD, no thyromegaly, no carotid bruits Lungs: no use of accessory muscles, no dullness to percussion, clear without rales or rhonchi  Cardiovascular: Rhythm regular, heart sounds  normal, no murmurs or gallops, no peripheral edema Musculoskeletal: No deformities, no cyanosis or clubbing          Assessment & Plan:

## 2017-10-13 ENCOUNTER — Ambulatory Visit (INDEPENDENT_AMBULATORY_CARE_PROVIDER_SITE_OTHER)
Admission: RE | Admit: 2017-10-13 | Discharge: 2017-10-13 | Disposition: A | Discharge status: Home / Self Care | Payer: BLUE CROSS/BLUE SHIELD | Source: Ambulatory Visit | Attending: Pulmonary Disease | Admitting: Pulmonary Disease

## 2017-10-13 ENCOUNTER — Other Ambulatory Visit: Payer: Self-pay | Admitting: Cardiovascular Disease

## 2017-10-13 ENCOUNTER — Encounter: Payer: Self-pay | Admitting: Pulmonary Disease

## 2017-10-13 DIAGNOSIS — D860 Sarcoidosis of lung: Secondary | ICD-10-CM | POA: Diagnosis not present

## 2017-10-13 DIAGNOSIS — D869 Sarcoidosis, unspecified: Secondary | ICD-10-CM | POA: Diagnosis not present

## 2017-10-13 NOTE — Telephone Encounter (Signed)
Note created in error.

## 2017-10-14 ENCOUNTER — Telehealth: Payer: Self-pay | Admitting: Pulmonary Disease

## 2017-10-14 NOTE — Telephone Encounter (Signed)
Editor: Oretha MilchAlva, Rakesh V, MD (Physician)     CXR today   We can drop prednisone to 15 mg daily  On July 5, drop  to 10 mg daily and stay on this dose until next visit  2 % cortisone cream for LA to spots on legs       Called and spoke to pt.  Pt had questions regarding prednisone dosage and Cortisone cream. Pt aware of above instructions on prednisone dosage. Pt also aware that Cortisone cream can be bought OTC. Pt had no further questions.  Nothing further is needed.

## 2017-10-25 LAB — ACID FAST CULTURE WITH REFLEXED SENSITIVITIES (MYCOBACTERIA): Acid Fast Culture: NEGATIVE

## 2017-11-28 ENCOUNTER — Encounter (HOSPITAL_COMMUNITY): Payer: Self-pay | Admitting: Emergency Medicine

## 2017-11-28 ENCOUNTER — Ambulatory Visit (HOSPITAL_COMMUNITY)
Admission: EM | Admit: 2017-11-28 | Discharge: 2017-11-28 | Disposition: A | Discharge status: Home / Self Care | Payer: BLUE CROSS/BLUE SHIELD | Attending: Family Medicine | Admitting: Family Medicine

## 2017-11-28 DIAGNOSIS — B029 Zoster without complications: Secondary | ICD-10-CM | POA: Diagnosis not present

## 2017-11-28 MED ORDER — TRAMADOL HCL 50 MG PO TABS: 50.0000 mg | ORAL_TABLET | Freq: Four times a day (QID) | ORAL | 0 refills | Status: DC | PRN

## 2017-11-28 MED ORDER — VALACYCLOVIR HCL 1 G PO TABS: 1000.0000 mg | ORAL_TABLET | Freq: Three times a day (TID) | ORAL | 0 refills | Status: AC

## 2017-11-28 MED ORDER — TETRACAINE HCL 0.5 % OP SOLN
OPHTHALMIC | Status: AC
  Filled 2017-11-28: qty 4

## 2017-11-28 MED ORDER — FLUORESCEIN SODIUM 1 MG OP STRP
ORAL_STRIP | OPHTHALMIC | Status: AC
  Filled 2017-11-28: qty 1

## 2017-11-28 NOTE — Discharge Instructions (Addendum)
Rest and use ice/heat as needed for symptomatic relief Prescribed valacyclovir 1000mg  3x/day for 10 days Use OTC medications such as ibuprofen/ tylenol.   Use tramadol for break-through pain.   Follow up with PCP in 7-10 days if rash is still present Follow up with PCP if symptoms of burning, stinging, tingling or numbness occur after rash resolves, you may need additional treatment Return here or go to ER if you have any new or worsening symptoms (such as eye involvement, severe pain, or signs of secondary infection such as fever, chills, nausea, vomiting, discharge, redness or warmth over site of rash)

## 2017-11-28 NOTE — ED Triage Notes (Signed)
Pt c/o rash on face, states hes on prednisone for sarcoidosis which makes him sensitive to the sun. Pt states he thinks it might be from the sun, he was outside Monday in the sun doing yard work.

## 2017-11-28 NOTE — ED Provider Notes (Addendum)
[facility]   [id number] 11/28/17 Arrival Time: 1102  SUBJECTIVE:  Royston BakeRussell Garrison Supan Jr. is a 52 y.o. male who presents with a rash on face that began 4 days ago.  Denies a precipitating event, but admits to doing yard work in the sun prior to symptoms.  Localizes the rash to right forehead.  Describes it as painful, red, and with active drainage. Describes pain as constant and throbbing with intermittent episodes of sharp and burning pain. Has tried aloe vera, hydrocortisone, baking soda, neosporin, and tylenol without relief.  Symptoms are made worse to the touch.  Denies similar symptoms in the past.   Complains of associate HA, and swelling.  Denies fever, chills, vision changes, blurry vision, double vision, spots in vision, nausea, vomiting, swollen glands, SOB, chest pain, abdominal pain, changes in bowel or bladder function.    Sarcoidosis and currently on prednisone.    Patient does report past hx of metal FB in right eye.    ROS: As per HPI.  Past Medical History:  Diagnosis Date  . A-V fistula (HCC)    a. 3.2015 Right Radial AVF s/p Radial cath/pci.  . Abnormal EKG   . Coronary artery disease    a. 06/2013 Cath/PCI: LM nl, LAD 99p (3.0x5716mm Promus DES), D1 80p, LCX nondom, nl, RCA nl, EF 60%.  . Family history of heart disease   . GERD (gastroesophageal reflux disease)   . Hyperlipidemia   . Pain of left upper extremity    Past Surgical History:  Procedure Laterality Date  . CORONARY ANGIOPLASTY  07/03/2013   LAD & DES TO DIAGONAL BRANCH   DR  BERRY  . ESOPHAGEAL DILATION    . LEFT HEART CATHETERIZATION WITH CORONARY ANGIOGRAM N/A 07/03/2013   Procedure: LEFT HEART CATHETERIZATION WITH CORONARY ANGIOGRAM;  Surgeon: Runell GessJonathan J Berry, MD;  [facility];  Service: Cardiovascular;  Laterality: N/A;  . VIDEO BRONCHOSCOPY Bilateral 09/08/2017   Procedure: VIDEO BRONCHOSCOPY WITH FLUORO;  Surgeon: Oretha MilchAlva, Rakesh V, MD;  Location: [facility] ENDOSCOPY;  Service:  Cardiopulmonary;  Laterality: Bilateral;   No Known Allergies No current facility-administered medications on file prior to encounter.    Current Outpatient Medications on File Prior to Encounter  Medication Sig Dispense Refill  . aspirin 81 MG tablet Take 81 mg by mouth daily.    Marland Kitchen. atorvastatin (LIPITOR) 80 MG tablet Take 0.5 tablets (40 mg total) by mouth daily at 6 PM. 90 tablet 3  . Cholecalciferol (VITAMIN D3) 5000 units CAPS Take 5,000 Units by mouth daily.     . Coenzyme Q10 (EQL COQ10) 300 MG CAPS Take 300 mg by mouth daily.     . folic acid (FOLVITE) 800 MCG tablet Take 800 mcg by mouth daily.     Stephanie Acre. Glucos-MSM-C-Mn-Ginger-Willow (GLUCOSAMINE MSM COMPLEX PO) Take 1,500 mg by mouth daily.     . Multiple Vitamins-Minerals (MULTIVITAMIN WITH MINERALS) tablet Take 1 tablet by mouth daily.    . nitroGLYCERIN (NITROSTAT) 0.4 MG SL tablet Place 1 tablet (0.4 mg total) under the tongue every 5 (five) minutes as needed for chest pain. 25 tablet 3  . omeprazole (PRILOSEC OTC) 20 MG tablet Take 20 mg by mouth every Monday, Wednesday, and Friday.    . predniSONE (DELTASONE) 10 MG tablet TAKE 1 TABLET BY MOUTH 2 TIMES DAILY WITH A MEAL. 60 tablet 1  . predniSONE (DELTASONE) 5 MG tablet Take 1 tablet (5 mg total) by mouth daily with breakfast. 30 tablet 1  . trolamine  salicylate (ASPERCREME) 10 % cream Apply 1 application topically as needed for muscle pain.    . vitamin B-12 (CYANOCOBALAMIN) 1000 MCG tablet Take 1,000 mcg by mouth daily.     Social History   Socioeconomic History  . Marital status: Married    Spouse name: Not on file  . Number of children: Not on file  . Years of education: Not on file  . Highest education level: Not on file  Occupational History  . Not on file  Social Needs  . Financial resource strain: Not on file  . Food insecurity:    Worry: Not on file    Inability: Not on file  . Transportation needs:    Medical: Not on file    Non-medical: Not on file    Tobacco Use  . Smoking status: Never Smoker  . Smokeless tobacco: Never Used  Substance and Sexual Activity  . Alcohol use: Yes    Comment: OCCASIONAL WINE  . Drug use: No  . Sexual activity: Not on file  Lifestyle  . Physical activity:    Days per week: Not on file    Minutes per session: Not on file  . Stress: Not on file  Relationships  . Social connections:    Talks on phone: Not on file    Gets together: Not on file    Attends religious service: Not on file    Active member of club or organization: Not on file    Attends meetings of clubs or organizations: Not on file    Relationship status: Not on file  . Intimate partner violence:    Fear of current or ex partner: Not on file    Emotionally abused: Not on file    Physically abused: Not on file    Forced sexual activity: Not on file  Other Topics Concern  . Not on file  Social History Narrative  . Not on file   No family history on file.  OBJECTIVE: Vitals:   11/28/17 1122  BP: (!) 136/94  Pulse: 86  Resp: 18  Temp: 98.6 F (37 C)  SpO2: 100%      Visual Acuity  Right Eye Distance: 20/50 Left Eye Distance: 20/30 Bilateral Distance: 20/30  Right Eye Near:   Left Eye Near:    Bilateral Near:     General appearance: alert; no distress HEENT: NCAT: Ears: RT EAC appears mildly erythematous, LT EAC clear, TMs pearly gray; Eyes: PERRL.  Right eye appears mildly injected, fluorescein exam without dendritic lesions; EOMI.  Sinuses nontender; Nose: no obvious rhinorrhea; tonsils nonerythematous, uvula midline Lungs: clear to auscultation bilaterally Heart: regular rate and rhythm.  Radial pulse 2+ bilaterally Extremities: no edema Skin: warm and dry; erythematous vesicular rash diffuse about the right forehead with involvement of right eyelid; no obvious involvement on nose; does not cross midline; no obvious weeping lesions; tender to palpation (see picture below) Psychological: alert and cooperative; normal  mood and affect      ASSESSMENT & PLAN:  1. Herpes zoster without complication    Discussed patient case with Dr. Delton See.  Dr. Delton See also examined the eye during fluorescein eye exam, and did not detect any dendritic lesions.    Meds ordered this encounter  Medications  . valACYclovir (VALTREX) 1000 MG tablet    Sig: Take 1 tablet (1,000 mg total) by mouth 3 (three) times daily for 10 days.    Dispense:  30 tablet    Refill:  0  Order Specific Question:   Supervising Provider    Answer:   Isa Rankin [[id number]]  . traMADol (ULTRAM) 50 MG tablet    Sig: Take 1 tablet (50 mg total) by mouth every 6 (six) hours as needed for moderate pain.    Dispense:  15 tablet    Refill:  0    Order Specific Question:   Supervising Provider    Answer:   Isa Rankin [[id number]]   Rest and use ice/heat as needed for symptomatic relief Prescribed valacyclovir 1000mg  3x/day for 10 days Use OTC medications such as ibuprofen/ tylenol.   Use tramadol for break-through pain.   Follow up with PCP in 7-10 days if rash is still present Follow up with PCP if symptoms of burning, stinging, tingling or numbness occur after rash resolves, you may need additional treatment Return here or go to ER if you have any new or worsening symptoms (such as eye involvement, severe pain, or signs of secondary infection such as fever, chills, nausea, vomiting, discharge, redness or warmth over site of rash)  Reviewed expectations re: course of current medical issues. Questions answered. Outlined signs and symptoms indicating need for more acute intervention. Patient verbalized understanding. After Visit Summary given.   Rennis Harding, PA-C 11/28/17 1907

## 2017-11-29 DIAGNOSIS — R51 Headache: Secondary | ICD-10-CM | POA: Diagnosis not present

## 2017-11-29 DIAGNOSIS — B029 Zoster without complications: Secondary | ICD-10-CM | POA: Diagnosis not present

## 2017-12-01 ENCOUNTER — Other Ambulatory Visit: Payer: Self-pay | Admitting: Pulmonary Disease

## 2017-12-09 ENCOUNTER — Ambulatory Visit (INDEPENDENT_AMBULATORY_CARE_PROVIDER_SITE_OTHER): Payer: BLUE CROSS/BLUE SHIELD | Admitting: Pulmonary Disease

## 2017-12-09 ENCOUNTER — Encounter: Payer: Self-pay | Admitting: Pulmonary Disease

## 2017-12-09 DIAGNOSIS — D869 Sarcoidosis, unspecified: Secondary | ICD-10-CM | POA: Diagnosis not present

## 2017-12-09 MED ORDER — TRIAMCINOLONE ACETONIDE 0.1 % EX OINT: TOPICAL_OINTMENT | CUTANEOUS | 2 refills | Status: DC

## 2017-12-09 NOTE — Progress Notes (Signed)
   Subjective:    Patient ID: Joseph Bakeussell Garrison Forlenza Jr., male    DOB: 02/05/1966, 52 y.o.   MRN: [id number]  HPI  Chief Complaint  Patient presents with  . Follow-up    Pt states he developed shingles 2 weeks ago and due to this he had to increase his prednisone. Last dose of abx was finished 12/08/17. Other than the shingles, pt states his breathing and the sarcoid has been doing good and denies any c/o cough or CP.    52 year old non-smoker forFU ofcough &  non-resolving RMLpneumonia since 07/2017  TBBX c/w granulomas  He also had skin lesions on his leg. He was started on 20 mg prednisone  in 08/2017 Still has spots on  back of his legs Only side effects with prednisone-night sweats &  hyperactive Cough is gone. Unfortunately he developed shingles and needed a higher dose of prednisone for a few days starting at 50 mg and is now tapered down to 10 mg twice daily   Significant tests/ events reviewed  08/2017 CT chest >>Mediastinal and bilateral hilar adenopathy with faint calcifications Extensive patchy nodular thickening of the peribronchovascular interstitium with fine nodularity of the fissures and peripheral pleural surfaces in both lungs  Bronchoscopy 09/08/2017 and both brushings and transbronchial biopsy showed noncaseating granulomas,AFB and fungal smears are negative  Urine histo antigen was negative  PFTs 08/2017 nml  Review of Systems Patient denies significant dyspnea,cough, hemoptysis,  chest pain, palpitations, pedal edema, orthopnea, paroxysmal nocturnal dyspnea, lightheadedness, nausea, vomiting, abdominal or  leg pains      Objective:   Physical Exam  Gen. Pleasant, well-nourished, in no distress ENT - no thrush, crusted lesions over forehead Neck: No JVD, no thyromegaly, no carotid bruits Lungs: no use of accessory muscles, no dullness to percussion, clear without rales or rhonchi  Cardiovascular: Rhythm regular, heart sounds  normal, no murmurs or  gallops, no peripheral edema Musculoskeletal: No deformities, no cyanosis or clubbing        Assessment & Plan:

## 2017-12-09 NOTE — Patient Instructions (Addendum)
Stay on 10 mg of prednisone for now. On September 1 dropped to 5 mg of prednisone daily. On September 15 onwards, take 5 mg every other day until end of September then stop  Chest x-ray on next visit. Triamcinolone 0.1% for local application

## 2017-12-09 NOTE — Assessment & Plan Note (Signed)
Will taper prednisone gradually to off for the next month  Stay on 10 mg of prednisone for now. On September 1 dropped to 5 mg of prednisone daily. On September 15 onwards, take 5 mg every other day until end of September then stop  Chest x-ray on next visit. Triamcinolone 0.1% for local application  We discussed prognosis of sarcoidosis

## 2017-12-13 DIAGNOSIS — B0239 Other herpes zoster eye disease: Secondary | ICD-10-CM | POA: Diagnosis not present

## 2017-12-13 DIAGNOSIS — H53141 Visual discomfort, right eye: Secondary | ICD-10-CM | POA: Diagnosis not present

## 2017-12-13 DIAGNOSIS — H16111 Macular keratitis, right eye: Secondary | ICD-10-CM | POA: Diagnosis not present

## 2017-12-13 DIAGNOSIS — B029 Zoster without complications: Secondary | ICD-10-CM | POA: Diagnosis not present

## 2017-12-16 DIAGNOSIS — B029 Zoster without complications: Secondary | ICD-10-CM | POA: Diagnosis not present

## 2017-12-17 DIAGNOSIS — H16111 Macular keratitis, right eye: Secondary | ICD-10-CM | POA: Diagnosis not present

## 2017-12-17 DIAGNOSIS — B0239 Other herpes zoster eye disease: Secondary | ICD-10-CM | POA: Diagnosis not present

## 2017-12-17 DIAGNOSIS — H53141 Visual discomfort, right eye: Secondary | ICD-10-CM | POA: Diagnosis not present

## 2017-12-17 DIAGNOSIS — B029 Zoster without complications: Secondary | ICD-10-CM | POA: Diagnosis not present

## 2017-12-22 ENCOUNTER — Telehealth: Payer: Self-pay | Admitting: Pulmonary Disease

## 2017-12-22 MED ORDER — TRIAMCINOLONE 0.1 % CREAM:EUCERIN CREAM 1:1: TOPICAL_CREAM | CUTANEOUS | 0 refills | Status: DC

## 2017-12-22 MED ORDER — TRIAMCINOLONE ACETONIDE 0.1 % EX OINT: TOPICAL_OINTMENT | CUTANEOUS | 2 refills | Status: DC

## 2017-12-22 NOTE — Telephone Encounter (Signed)
Called and spoke to pt's wife, Joseph Valencia. She is stating the rx that was just called in is still the ointment. I spoke with [place] and she spoke with a tech that verbally told [place] she would refill the cream instead of an oil based ointment. [facility] and spoke with the [facility]ist and gave a verbal order for the cream of Triamcinolone 0.1%. She verbalized that this is not the oil based ointment. Called and spoke with Joseph Valencia to inform her. Joseph Valencia verbalized understanding and denied any further questions or concerns at this time.

## 2017-12-22 NOTE — Telephone Encounter (Signed)
Pt wife is calling back. States that Triamcinolone is the medication that is needing to be changed, not the Trolamine Salicylate as previously stated. [facility] [address] Cb is [phone].

## 2017-12-22 NOTE — Telephone Encounter (Signed)
Spoke with wife lydia, patient needed a refill of triamcinolone cream. Pharmacy confirmed and refill sent. Informed lydia. Nothing further needed at this time.

## 2017-12-22 NOTE — Telephone Encounter (Signed)
Left message for patient's wife to call back. Aspercreme is OTC and does not need a RX.

## 2017-12-31 DIAGNOSIS — H16111 Macular keratitis, right eye: Secondary | ICD-10-CM | POA: Diagnosis not present

## 2017-12-31 DIAGNOSIS — B0239 Other herpes zoster eye disease: Secondary | ICD-10-CM | POA: Diagnosis not present

## 2017-12-31 DIAGNOSIS — H53141 Visual discomfort, right eye: Secondary | ICD-10-CM | POA: Diagnosis not present

## 2018-01-21 DIAGNOSIS — B0239 Other herpes zoster eye disease: Secondary | ICD-10-CM | POA: Diagnosis not present

## 2018-01-21 DIAGNOSIS — H53141 Visual discomfort, right eye: Secondary | ICD-10-CM | POA: Diagnosis not present

## 2018-01-21 DIAGNOSIS — H16111 Macular keratitis, right eye: Secondary | ICD-10-CM | POA: Diagnosis not present

## 2018-02-16 DIAGNOSIS — D860 Sarcoidosis of lung: Secondary | ICD-10-CM | POA: Diagnosis not present

## 2018-02-16 DIAGNOSIS — Z79899 Other long term (current) drug therapy: Secondary | ICD-10-CM | POA: Diagnosis not present

## 2018-02-16 DIAGNOSIS — E559 Vitamin D deficiency, unspecified: Secondary | ICD-10-CM | POA: Diagnosis not present

## 2018-02-16 DIAGNOSIS — Z125 Encounter for screening for malignant neoplasm of prostate: Secondary | ICD-10-CM | POA: Diagnosis not present

## 2018-02-16 DIAGNOSIS — Z0001 Encounter for general adult medical examination with abnormal findings: Secondary | ICD-10-CM | POA: Diagnosis not present

## 2018-02-16 DIAGNOSIS — I2510 Atherosclerotic heart disease of native coronary artery without angina pectoris: Secondary | ICD-10-CM | POA: Diagnosis not present

## 2018-02-16 DIAGNOSIS — B0229 Other postherpetic nervous system involvement: Secondary | ICD-10-CM | POA: Diagnosis not present

## 2018-02-17 ENCOUNTER — Encounter: Payer: Self-pay | Admitting: Pulmonary Disease

## 2018-02-17 ENCOUNTER — Ambulatory Visit (INDEPENDENT_AMBULATORY_CARE_PROVIDER_SITE_OTHER)
Admission: RE | Admit: 2018-02-17 | Discharge: 2018-02-17 | Disposition: A | Discharge status: Home / Self Care | Payer: BLUE CROSS/BLUE SHIELD | Source: Ambulatory Visit | Attending: Pulmonary Disease | Admitting: Pulmonary Disease

## 2018-02-17 ENCOUNTER — Ambulatory Visit (INDEPENDENT_AMBULATORY_CARE_PROVIDER_SITE_OTHER): Payer: BLUE CROSS/BLUE SHIELD | Admitting: Pulmonary Disease

## 2018-02-17 DIAGNOSIS — R0989 Other specified symptoms and signs involving the circulatory and respiratory systems: Secondary | ICD-10-CM | POA: Diagnosis not present

## 2018-02-17 DIAGNOSIS — D869 Sarcoidosis, unspecified: Secondary | ICD-10-CM

## 2018-02-17 MED ORDER — TRIAMCINOLONE ACETONIDE 0.5 % EX CREA: TOPICAL_CREAM | Freq: Two times a day (BID) | CUTANEOUS | Status: DC

## 2018-02-17 NOTE — Progress Notes (Signed)
   Subjective:    Patient ID: Joseph Valencia., male    DOB: 1965/12/15, 52 y.o.   MRN: [id number]  HPI  52 year old non-smoker forFU ofsarcoidosis Presented as cough &  non-resolving RMLpneumoniasince 07/2017 TBBX c/w granulomas  He also had skin lesions on his leg. He was started on 20 mg prednisone in 08/2017 - tolerated well  he developed shingles 11/2017  He remains on 5 mg of prednisone every day, cough is resolved. Sores persist on his leg in spite of application 0.1% cream  Chest x-ray reviewed today which shows no new infiltrates persistent right lower lobe interstitial opacities    Significant tests/ events reviewed  08/2017 CT chest >>Mediastinal and bilateral hilar adenopathy with faint calcifications Extensive patchy nodular thickening of the peribronchovascular interstitium with fine nodularity of the fissures and peripheral pleural surfaces in both lungs  Bronchoscopy 09/08/2017 and both brushings and transbronchial biopsy showed noncaseating granulomas,AFB and fungal smears are negative  Urine histo antigen was negative  PFTs 08/2017 nml  Review of Systems neg for any significant sore throat, dysphagia, itching, sneezing, nasal congestion or excess/ purulent secretions, fever, chills, sweats, unintended wt loss, pleuritic or exertional cp, hempoptysis, orthopnea pnd or change in chronic leg swelling. Also denies presyncope, palpitations, heartburn, abdominal pain, nausea, vomiting, diarrhea or change in bowel or urinary habits, dysuria,hematuria, rash, arthralgias, visual complaints, headache, numbness weakness or ataxia.     Objective:   Physical Exam  Gen. Pleasant, well-nourished, in no distress ENT - no thrush, no post nasal drip Neck: No JVD, no thyromegaly, no carotid bruits Lungs: no use of accessory muscles, no dullness to percussion, clear without rales or rhonchi  Cardiovascular: Rhythm regular, heart sounds  normal, no murmurs or  gallops, no peripheral edema Musculoskeletal: No deformities, no cyanosis or clubbing  Skin-lesions on his left thigh popliteal fossa and calf, resolved lesion with pigmentation on his right calf       Assessment & Plan:

## 2018-02-17 NOTE — Patient Instructions (Signed)
Decrease prednisone to 5 mg on Monday/Wednesday/Friday for November. Starting December stop taking prednisone.  Triamcinolone 0.5% cream to affected areas. Chest x-ray next visit

## 2018-02-17 NOTE — Addendum Note (Signed)
Addended by: Maxwell Marion A on: 02/17/2018 10:12 AM   Modules accepted: Orders

## 2018-02-17 NOTE — Assessment & Plan Note (Signed)
Decrease prednisone to 5 mg on Monday/Wednesday/Friday for November. Starting December stop taking prednisone.  Triamcinolone 0.5% cream to affected areas. Chest x-ray next visit 

## 2018-02-18 ENCOUNTER — Telehealth: Payer: Self-pay | Admitting: Pulmonary Disease

## 2018-02-18 MED ORDER — TRIAMCINOLONE ACETONIDE 0.5 % EX OINT: 1.0000 "application " | TOPICAL_OINTMENT | Freq: Two times a day (BID) | CUTANEOUS | 0 refills | Status: DC

## 2018-02-18 NOTE — Telephone Encounter (Signed)
Checked to see if the Rx was sent in for pt and saw that RA had accidentally done the clinic administration instead of sending it to the pharmacy  I sent the Rx to pt's pharmacy and discontinued the clinic administered version of the triamcinolone cream.  Called and spoke with pt's wife Joseph Valencia letting her know that the Rx had been sent in for pt. [name] expressed understanding. Nothing further needed.

## 2018-02-21 ENCOUNTER — Other Ambulatory Visit: Payer: Self-pay | Admitting: Pulmonary Disease

## 2018-02-21 NOTE — Telephone Encounter (Signed)
Patients wife Isabelle Course called stating went to pharmacy and Triamcinolone  0.5% they filled as an oinment.  Was suppose to filled as cream.  [facility] [address]  [phone].  Patients call back phone number is [phone].

## 2018-02-21 NOTE — Addendum Note (Signed)
Addended by: Sylvester Harder on: 02/21/2018 03:35 PM   Modules accepted: Orders

## 2018-02-21 NOTE — Telephone Encounter (Signed)
Med list updated

## 2018-02-21 NOTE — Telephone Encounter (Signed)
Had to [facility] due to [facility] not having it in the system. Order place and nothing further needed at this time.

## 2018-05-20 ENCOUNTER — Other Ambulatory Visit: Payer: Self-pay

## 2018-05-20 ENCOUNTER — Encounter: Payer: Self-pay | Admitting: Pulmonary Disease

## 2018-05-20 ENCOUNTER — Ambulatory Visit (INDEPENDENT_AMBULATORY_CARE_PROVIDER_SITE_OTHER): Payer: 59 | Admitting: Pulmonary Disease

## 2018-05-20 ENCOUNTER — Ambulatory Visit (INDEPENDENT_AMBULATORY_CARE_PROVIDER_SITE_OTHER)
Admission: RE | Admit: 2018-05-20 | Discharge: 2018-05-20 | Disposition: A | Discharge status: Home / Self Care | Payer: BLUE CROSS/BLUE SHIELD | Source: Ambulatory Visit | Attending: Pulmonary Disease | Admitting: Pulmonary Disease

## 2018-05-20 VITALS — BP 112/76 | HR 91 | Ht 73.0 in | Wt 228.6 lb

## 2018-05-20 DIAGNOSIS — D869 Sarcoidosis, unspecified: Secondary | ICD-10-CM

## 2018-05-20 MED ORDER — PREDNISONE 5 MG PO TABS: ORAL_TABLET | ORAL | 1 refills | Status: DC

## 2018-05-20 MED ORDER — TRIAMCINOLONE ACETONIDE 0.5 % EX OINT: 1.0000 "application " | TOPICAL_OINTMENT | Freq: Two times a day (BID) | CUTANEOUS | 2 refills | Status: DC

## 2018-05-20 NOTE — Patient Instructions (Signed)
Chest x-ray today. Apply steroid cream on skin lesions on your legs

## 2018-05-20 NOTE — Progress Notes (Signed)
   Subjective:    Patient ID: Joseph Valencia., male    DOB: 10/08/1965, 53 y.o.   MRN: [id number]  HPI  53 year old non-smoker forFU ofsarcoidosis -lungs & skin  Presented as cough &non-resolving RMLpneumoniasince 07/2017 TBBX c/w granulomas  He also had skin lesions on his leg. He was started on 20 mg prednisonein 08/2017 - tolerated well, tapered to off December  he developed shingles 11/2017  Breathing is good, he reports a dry cough that is resurfaced.  Skin lesions on his legs persist and if he does not apply the cream these do get worse  Has started taking turmeric as a supplement    Significant tests/ events reviewed  08/2017 CT chest >>Mediastinal and bilateral hilar adenopathy with faint calcifications Extensive patchy nodular thickening of the peribronchovascular interstitium with fine nodularity of the fissures and peripheral pleural surfaces in both lungs  Bronchoscopy 09/08/2017 and both brushings and transbronchial biopsy showed noncaseating granulomas,AFB and fungal smears are negative  Urine histo antigen was negative  PFTs 08/2017 nml  Review of Systems Patient denies significant dyspnea,cough, hemoptysis,  chest pain, palpitations, pedal edema, orthopnea, paroxysmal nocturnal dyspnea, lightheadedness, nausea, vomiting, abdominal or  leg pains      Objective:   Physical Exam  Gen. Pleasant, well-nourished, in no distress ENT - no thrush, no pallor/icterus,no post nasal drip Neck: No JVD, no thyromegaly, no carotid bruits Lungs: no use of accessory muscles, no dullness to percussion, clear without rales or rhonchi  Cardiovascular: Rhythm regular, heart sounds  normal, no murmurs or gallops, no peripheral edema Musculoskeletal: No deformities, no cyanosis or clubbing  Skin - reddish purple  lesions over both legs       Assessment & Plan:

## 2018-05-20 NOTE — Assessment & Plan Note (Addendum)
Sarcoidosis seems to have come back on this legs.  Concerned that cough might indicate that it is resurfacing in his lungs too. Will obtain chest x-ray today and decide whether he needs to go back on prednisone  Continue triamcinolone application over his legs   Addendum-right lower lobe opacity appears slightly worse on chest x-ray.  Will resume prednisone 5 mg Monday/Wednesday/Friday

## 2018-06-06 ENCOUNTER — Telehealth: Payer: Self-pay | Admitting: Pulmonary Disease

## 2018-06-06 MED ORDER — TRIAMCINOLONE ACETONIDE 0.5 % EX CREA: 1.0000 "application " | TOPICAL_CREAM | Freq: Two times a day (BID) | CUTANEOUS | 2 refills | Status: DC

## 2018-06-06 NOTE — Telephone Encounter (Signed)
Spoke with pt's wife, Isabelle Course. At the pt's last OV, a refill on Triamcinolone Ointment was sent in. The pt does not want an ointment, he prefers the cream. A new prescription has been sent in for Triamcinolone Cream. Nothing further was needed.

## 2018-07-29 ENCOUNTER — Ambulatory Visit (INDEPENDENT_AMBULATORY_CARE_PROVIDER_SITE_OTHER): Payer: BLUE CROSS/BLUE SHIELD | Admitting: Pulmonary Disease

## 2018-07-29 ENCOUNTER — Other Ambulatory Visit: Payer: Self-pay

## 2018-07-29 ENCOUNTER — Encounter: Payer: Self-pay | Admitting: Pulmonary Disease

## 2018-07-29 ENCOUNTER — Telehealth: Payer: Self-pay | Admitting: Pulmonary Disease

## 2018-07-29 DIAGNOSIS — D869 Sarcoidosis, unspecified: Secondary | ICD-10-CM

## 2018-07-29 DIAGNOSIS — D8689 Sarcoidosis of other sites: Secondary | ICD-10-CM

## 2018-07-29 NOTE — Assessment & Plan Note (Addendum)
Assessment: Transbronchial biopsy consistent with granulomas in April/2019 08/2017 bronchoscopy showing noncaseating granulomas 08/2016 PFTs normal January/2020 chest x-ray shows increased nodular interstitial opacities, opacity right lung base is prominent Maintained on Monday Wednesday Friday 5 mg prednisone  Plan: Continue Monday Wednesday Friday 5 mg prednisone daily Referral to dermatology for further management of skin sarcoid >>> Continue triamcinolone cream 6-week telephonic outreach with Elisha Headland, FNP Consider chest x-ray at next office visit

## 2018-07-29 NOTE — Progress Notes (Signed)
Virtual Visit via Telephone Note  I connected with Joseph Valencia. on 07/29/18 at  1:30 PM EDT by telephone and verified that I am speaking with the correct person using two identifiers.   I discussed the limitations, risks, security and privacy concerns of performing an evaluation and management service by telephone and the availability of in person appointments. I also discussed with the patient that there may be a patient responsible charge related to this service. The patient expressed understanding and agreed to proceed.   History of Present Illness: 53 year old non-smoker forFU ofsarcoidosis -lungs & skin  Presented ascough &non-resolving RMLpneumoniasince 07/2017 TBBX c/w granulomas  Patient consented to consult via telephone: Yes People present and their role in pt care: Pt   Chief complaint: Sarcoidosis  53 year old male followed in our office for sarcoidosis.  Patient with transbronchial biopsy proven sarcoid in April/2019.  Patient was last seen in our office in January/2020.  At that time patient was coughing more.  A chest x-ray was performed that showed increased nodular interstitial opacities especially at right lung base.  This prompted Dr. Vassie Loll to restart patient on Monday Wednesday Friday prednisone 5 mg.  Patient reports that symptomatically he has been well controlled still has an occasional cough but feels that he is breathing fine.  He just had concerns about remaining on prednisone long-term.  Patient reports that his skin management is about the same.  He has been using his triamcinolone cream.  Patient was last seen by dermatology Campbell Stall 2 to 3 years ago.    Observations/Objective:  05/20/2018-chest x-ray-chronic increased nodular interstitial opacities are identified in the bilateral perihilar region and in the right lung base, the opacity of the right lung base slightly more prominent compared to prior exam  08/2017 CT chest >>Mediastinal and  bilateral hilar adenopathy with faint calcifications Extensive patchy nodular thickening of the peribronchovascular interstitium with fine nodularity of the fissures and peripheral pleural surfaces in both lungs  Bronchoscopy 09/08/2017 and both brushings and transbronchial biopsy showed noncaseating granulomas,AFB and fungal smears are negative  Urine histo antigen was negative  PFTs 08/2017 nml  No results found for: NITRICOXIDE  Assessment and Plan:  Sarcoidosis Assessment: Transbronchial biopsy consistent with granulomas in April/2019 08/2017 bronchoscopy showing noncaseating granulomas 08/2016 PFTs normal January/2020 chest x-ray shows increased nodular interstitial opacities, opacity right lung base is prominent Maintained on Monday Wednesday Friday 5 mg prednisone  Plan: Continue Monday Wednesday Friday 5 mg prednisone daily Referral to dermatology for further management of skin sarcoid >>> Continue triamcinolone cream 6-week telephonic outreach with Elisha Headland, FNP Consider chest x-ray at next office visit    Follow Up Instructions:  Return in about 6 weeks (around 09/09/2018), or if symptoms worsen or fail to improve, for Follow up with Elisha Headland FNP-C.    I discussed the assessment and treatment plan with the patient. The patient was provided an opportunity to ask questions and all were answered. The patient agreed with the plan and demonstrated an understanding of the instructions.   The patient was advised to call back or seek an in-person evaluation if the symptoms worsen or if the condition fails to improve as anticipated.  I provided 23 minutes of non-face-to-face time during this encounter.   Coral Ceo, NP

## 2018-07-29 NOTE — Telephone Encounter (Signed)
Spoke with pt's wife, she is concerned about the patient taking the prednisone for an extended period of time. The appointment he had scheduled was moved out until July. He is on Prednisone on 5mg  every other day and he notices when the temperature changes he starts to cough more. I scheduled him with Arlys John for a televisit at 1pm to discuss plan. Nothing further is needed.

## 2018-07-29 NOTE — Patient Instructions (Addendum)
Continue Monday Wednesday Friday 5 mg daily prednisone  Referral to dermatology, have them further assess management of sarcoidosis in the near skin  Return in about 6 weeks (around 09/09/2018), or if symptoms worsen or fail to improve, for Follow up with Elisha Headland FNP-C.   May need to consider chest x-ray at next in office visit      Coronavirus (COVID-19) Are you at risk?  Are you at risk for the Coronavirus (COVID-19)?  To be considered HIGH RISK for Coronavirus (COVID-19), you have to meet the following criteria:  . Traveled to [place], [place], [place], [place] or [place]; or in the [place] to [place], [place], [place], or [place]; and have fever, cough, and shortness of breath within the last 2 weeks of travel OR . Been in close contact with a person diagnosed with COVID-19 within the last 2 weeks and have fever, cough, and shortness of breath . IF YOU DO NOT MEET THESE CRITERIA, YOU ARE CONSIDERED LOW RISK FOR COVID-19.  What to do if you are HIGH RISK for COVID-19?  Marland Kitchen If you are having a medical emergency, call 911. . Seek medical care right away. Before you go to a doctor's office, urgent care or emergency department, call ahead and tell them about your recent travel, contact with someone diagnosed with COVID-19, and your symptoms. You should receive instructions from your physician's office regarding next steps of care.  . When you arrive at healthcare provider, tell the healthcare staff immediately you have returned from visiting [place], [place], [place], [place] or [place]; or traveled in the [place] to [place], [place], [place], or [place]; in the last two weeks or you have been in close contact with a person diagnosed with COVID-19 in the last 2 weeks.   . Tell the health care staff about your symptoms: fever, cough and shortness of breath. . After you have been seen by a medical provider, you will be either: o Tested for (COVID-19) and discharged  home on quarantine except to seek medical care if symptoms worsen, and asked to  - Stay home and avoid contact with others until you get your results (4-5 days)  - Avoid travel on public transportation if possible (such as bus, train, or airplane) or o Sent to the Emergency Department by EMS for evaluation, COVID-19 testing, and possible admission depending on your condition and test results.  What to do if you are LOW RISK for COVID-19?  Reduce your risk of any infection by using the same precautions used for avoiding the common cold or flu:  Marland Kitchen Wash your hands often with soap and warm water for at least 20 seconds.  If soap and water are not readily available, use an alcohol-based hand sanitizer with at least 60% alcohol.  . If coughing or sneezing, cover your mouth and nose by coughing or sneezing into the elbow areas of your shirt or coat, into a tissue or into your sleeve (not your hands). . Avoid shaking hands with others and consider head nods or verbal greetings only. . Avoid touching your eyes, nose, or mouth with unwashed hands.  . Avoid close contact with people who are sick. . Avoid places or events with large numbers of people in one location, like concerts or sporting events. . Carefully consider travel plans you have or are making. . If you are planning any travel outside or inside the [place], visit the CDC's Travelers' Health webpage for the latest health  notices. . If you have some symptoms but not all symptoms, continue to monitor at home and seek medical attention if your symptoms worsen. . If you are having a medical emergency, call 911.   ADDITIONAL HEALTHCARE OPTIONS FOR PATIENTS  [facility] / e-Visit: [email or web address]         [facility]: [phone]  [facility]: [phone]                   [facility]: [phone]           It is flu season:   >>> Best ways  to protect herself from the flu: Receive the yearly flu vaccine, practice good hand hygiene washing with soap and also using hand sanitizer when available, eat a nutritious meals, get adequate rest, hydrate appropriately   Please contact the office if your symptoms worsen or you have concerns that you are not improving.   Thank you for choosing [facility] for your healthcare, and for allowing [place] to partner with you on your healthcare journey. I am thankful to be able to provide care to you today.   Elisha Headland FNP-C     Sarcoidosis  Sarcoidosis is a disease that can cause inflammation in many areas of the body. It most often affects the lungs (pulmonary sarcoidosis). Sarcoidosis can also affect the lymph nodes, liver, eyes, skin, heart, or any other body tissue. Normally, cells that are part of your body's disease-fighting system (immune system) attack harmful substances (such as germs) in your body. This immune system response causes inflammation. After the harmful substance is destroyed, the inflammation and the immune cells go away. When you have sarcoidosis, your immune system causes inflammation even when there are no harmful substances, and the inflammation does not go away. Sarcoidosis also causes cells from your immune system to form small clumps of tissue (granulomas) in the affected area of your body. What are the causes? The exact cause of sarcoidosis is not known.  It is possible that if you have a family history of this disease (genetic predisposition), the immune system response that leads to inflammation may be triggered by something in your environment, such as:  Bacteria or viruses.  Metals.  Chemicals.  Dust.  Mold or mildew. What increases the risk? You may be at a greater risk for sarcoidosis if you:  Have a family history of the disease.  Are African-American.  Are of Northern European descent.  Are 45-34 years old.  Work as a  IT sales professional.  Work in an environment where you are exposed to metals, chemicals, mold or mildew, or insecticides. What are the signs or symptoms? Some people with sarcoidosis have no symptoms. Others have very mild symptoms. The symptoms usually depend on the organ that is affected. Sarcoidosis most often affects the lungs, which may include symptoms such as:  Chest pain.  Coughing.  Wheezing.  Shortness of breath. Other common symptoms include:  Night sweats.  Fever.  Weight loss.  Fatigue.  Swollen lymph nodes.  Joint pain. How is this diagnosed? Sarcoidosis may be diagnosed based on:  Your symptoms and medical history.  A physical exam.  Imaging tests to check for granulomas such as: ? Chest X-ray. ? CT scan. ? MRI. ? PET scan.  Lung function tests. These tests evaluate your breathing and check for problems that may be related to sarcoidosis.  A procedure to remove a tissue sample for testing (biopsy). You may have a  biopsy of lung tissue if that is where you are having symptoms. You may have tests to check for any complications of the condition. These tests may include:  Eye exams.  MRI of the heart or brain.  Echocardiogram.  Electrocardiogram (EKG or ECG). How is this treated? In some cases, sarcoidosis does not require a specific treatment because it causes no symptoms or mild symptoms. If your symptoms bother you or are severe, you may be prescribed medicines to reduce inflammation or relieve symptoms. These medicines may include:  Prednisone. This is a steroid that reduces inflammation related to sarcoidosis.  Hydroxychloroquine. This may be used to treat sarcoidosis that affects the skin, eyes, or brain.  Methotrexate, leflunomide, or azathioprine. These medicines affect the immune system and can help with sarcoidosis in the joints, eyes, skin, or lungs.  Medicines that you breathe in (inhalers). Inhalers can help you breathe if sarcoidosis  affects your lungs. Follow these instructions at home:   Do not use any products that contain nicotine or tobacco, such as cigarettes and e-cigarettes. If you need help quitting, ask your health care provider.  Avoid secondhand smoke and irritating dust or chemicals. Stay indoors on days when air quality is poor in your area.  Return to your normal activities as told by your health care provider. Ask your health care provider what activities are safe for you.  Take or use over-the-counter and prescription medicines only as told by your health care provider.  Keep all follow-up visits as told by your health care provider. This is important. Contact a health care provider if:  You have vision problems.  You have a dry cough that does not go away.  You have an irregular heartbeat.  You have pain or aches in your joints, hands, or feet.  You have an unexplained rash. Get help right away if:  You have chest pain.  You have difficulty breathing. Summary  Sarcoidosis is a disease that can cause inflammation in many body areas of the body. It most often affects the lungs (pulmonary sarcoidosis). It can also affect the lymph nodes, liver, eyes, skin, heart, or any other body tissue.  When you have sarcoidosis, cells from your immune system form small clumps of tissue (granulomas) in the affected area of your body.  Sarcoidosis sometimes does not require a specific treatment because it causes no symptoms or mild symptoms.  If your symptoms bother you or are severe, you may be prescribed medicines to reduce inflammation or relieve symptoms. This information is not intended to replace advice given to you by your health care provider. Make sure you discuss any questions you have with your health care provider. Document Released: 02/12/2004 Document Revised: 01/19/2017 Document Reviewed: 01/19/2017 Elsevier Interactive Patient Education  2019 [employer].

## 2018-08-18 IMAGING — DX DG CHEST 1V PORT
1 series · 1 of 1 positions shown · non-contrast
Comparison: CT scan of the chest July 07, 2017 and chest x-ray
August 27, 2017

CLINICAL DATA: Status post biopsy of right middle and right lower
lobe lesions.

EXAM:
PORTABLE CHEST 1 VIEW

[chest ap]
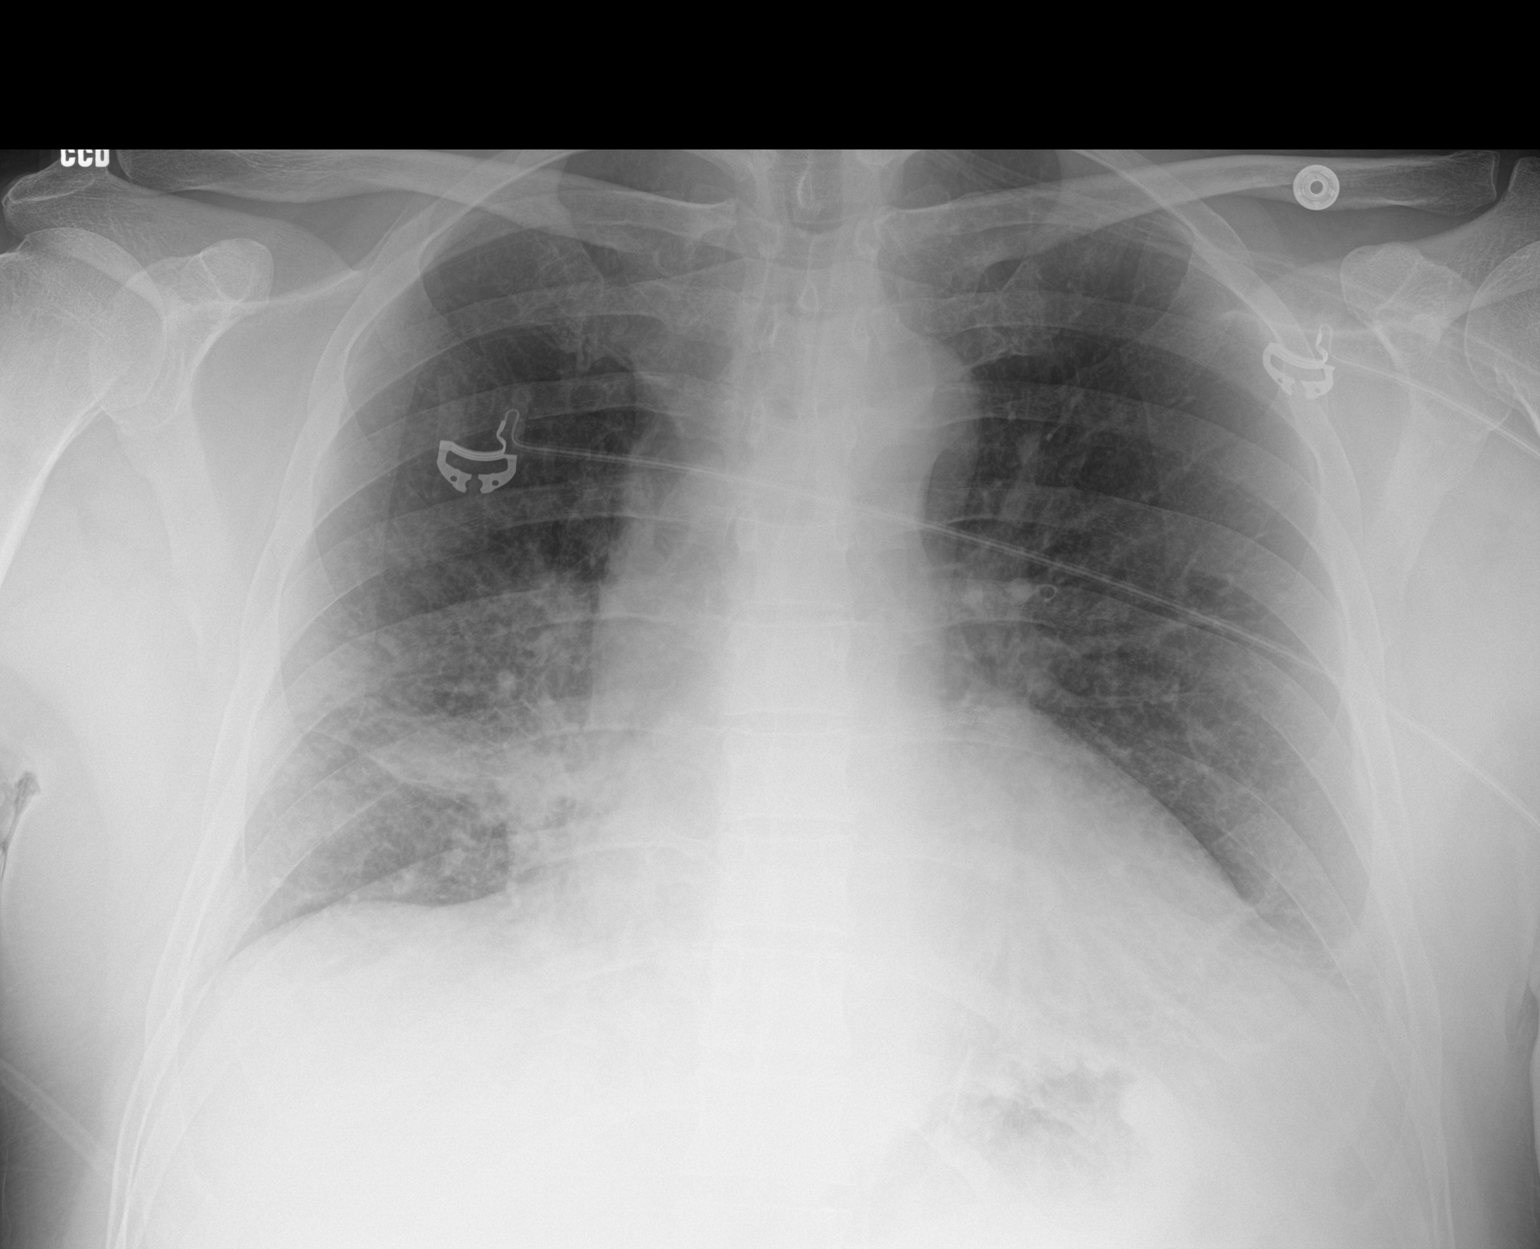

[1 of 1 positions shown; findings below may reference images not displayed]

FINDINGS: The lungs are well-expanded. There is comes fluid airspace opacity
in the right infrahilar region with surrounding increased
interstitial density. The left lung exhibits minimal interstitial
prominence that is stable. There is no pleural effusion or
pneumothorax. The heart is normal in size. The pulmonary vascularity
is not engorged.
IMPRESSION: Perihilar atelectasis on the right which appears new. Stable
increased interstitial density in the right perihilar region and to
a lesser extent on the left. No pneumothorax.

## 2018-09-07 NOTE — Progress Notes (Signed)
Virtual Visit via Telephone Note  I connected with Joseph Valencia. on 09/08/18 at  9:00 AM EDT by telephone and verified that I am speaking with the correct person using two identifiers.  Location: Patient: Home Provider: Office [facility] - [address] [place], [address], [place], [place] [address]   I discussed the limitations, risks, security and privacy concerns of performing an evaluation and management service by telephone and the availability of in person appointments. I also discussed with the patient that there may be a patient responsible charge related to this service. The patient expressed understanding and agreed to proceed.  Patient consented to consult via telephone: Yes People present and their role in pt care: Pt    History of Present Illness: 53 year old non-smoker forFU ofsarcoidosis -lungs & skin  Presented ascough &non-resolving RMLpneumoniasince 07/2017 TBBX c/w granulomas Pt of: Dr Vassie Loll  Chief complaint: Sarcoidosis, 6 week follow up    53 year old male never smoker completing a 6-week tele-visit with our office today.  Patient reports that symptoms have been stable since last tele-visit 6 weeks ago.  Patient does admit that he has had increased congestion which typically happens whenever temperatures fluctuate like they have been doing outside.  Patient continues to be maintained on Monday Wednesday Friday 5 mg of prednisone.  Patient reports that he is able to walk 3 miles a day.  He is having no shortness of breath, worsened fatigue, wheezing, chest pain, palpitations, chest wall pain.  Patient does admit the nasal congestion which improves after blowing his nose for 10-12 times in the morning while he is walking.  Patient's wife is providing the patient Mucinex to help him with the congestion.  Patient continues to have flared areas of his skin believed to be related to his sarcoidosis.  He has a pending referral to dermatology.  Patient reports  that they have called him he has not called them back to get scheduled.  They are currently using Neosporin to help with the management of skin.  He reports that the Neosporin has helped.  They do have a steroid cream but they feel that this is aggravated some of the sites.  So they are using the Neosporin more regularly.  Observations/Objective:  05/20/2018-chest x-ray-chronic increased nodular interstitial opacities are identified in the bilateral perihilar region and in the right lung base, the opacity of the right lung base slightly more prominent compared to prior exam  08/2017 CT chest >>Mediastinal and bilateral hilar adenopathy with faint calcifications Extensive patchy nodular thickening of the peribronchovascular interstitium with fine nodularity of the fissures and peripheral pleural surfaces in both lungs  Bronchoscopy 09/08/2017 and both brushings and transbronchial biopsy showed noncaseating granulomas,AFB and fungal smears are negative  Urine histo antigen was negative  PFTs 08/2017 nml  Assessment and Plan:  Sarcoidosis Assessment: Transbronchial biopsy consistent with granulomas in April/2019 08/2017 bronchoscopy showing noncaseating granulomas 08/2016 PFTs normal January/2020 chest x-ray shows increased nodular interstitial opacities, opacity right lung base most prominent January/2020 started on Monday Wednesday Friday 5 mg of prednisone Patient is maintained on Monday Wednesday Friday 5 mg prednisone Patient has flared areas on his skin, specifically shins believed to be from sarcoidosis Pending referral to dermatology at this time-Dr. Campbell Stall  Plan: Continue Monday Wednesday Friday 5 mg prednisone daily Schedule appointment with dermatology for further management of skin sarcoid Can continue to use triamcinolone cream and/or Neosporin for management of skin Complete chest x-ray within 1 month to compare to January/2020 chest  x-ray 5346-month follow-up with Dr.  Vassie LollAlva Continue to exercise daily Notify our office if you have any worsening symptoms or concerns   Follow Up Instructions:  Return in about 3 months (around 12/09/2018), or if symptoms worsen or fail to improve, for Follow up with Dr. Vassie LollAlva.   I discussed the assessment and treatment plan with the patient. The patient was provided an opportunity to ask questions and all were answered. The patient agreed with the plan and demonstrated an understanding of the instructions.   The patient was advised to call back or seek an in-person evaluation if the symptoms worsen or if the condition fails to improve as anticipated.  I provided 23 minutes of non-face-to-face time during this encounter.   Coral CeoBrian P Mack, NP

## 2018-09-08 ENCOUNTER — Encounter: Payer: Self-pay | Admitting: Pulmonary Disease

## 2018-09-08 ENCOUNTER — Other Ambulatory Visit: Payer: Self-pay

## 2018-09-08 ENCOUNTER — Ambulatory Visit (INDEPENDENT_AMBULATORY_CARE_PROVIDER_SITE_OTHER): Payer: 59 | Admitting: Pulmonary Disease

## 2018-09-08 DIAGNOSIS — D869 Sarcoidosis, unspecified: Secondary | ICD-10-CM | POA: Diagnosis not present

## 2018-09-08 NOTE — Patient Instructions (Signed)
Continue Monday Wednesday Friday 5 mg daily prednisone  Contact dermatology, schedule an appointment and have them further assess management of sarcoidosis in the near skin  Please come to our office within the next month to complete a chest x-ray  Continue to exercise regularly, great job walking   Notify our office if you have worsened breathing, chest pain, chest wall pain, increased fatigue, etc.   Return in about 3 months (around 12/09/2018), or if symptoms worsen or fail to improve, for Follow up with Dr. Vassie LollAlva.     Coronavirus (COVID-19) Are you at risk?  Are you at risk for the Coronavirus (COVID-19)?  To be considered HIGH RISK for Coronavirus (COVID-19), you have to meet the following criteria:  . Traveled to [place], [place], [place], [place] or [place]; or in the [place] to [place], [place], or [place]; and have fever, cough, and shortness of breath within the last 2 weeks of travel OR . Been in close contact with a person diagnosed with COVID-19 within the last 2 weeks and have fever, cough, and shortness of breath . IF YOU DO NOT MEET THESE CRITERIA, YOU ARE CONSIDERED LOW RISK FOR COVID-19.  What to do if you are HIGH RISK for COVID-19?  Marland Kitchen. If you are having a medical emergency, call 911. . Seek medical care right away. Before you go to a doctor's office, urgent care or emergency department, call ahead and tell them about your recent travel, contact with someone diagnosed with COVID-19, and your symptoms. You should receive instructions from your physician's office regarding next steps of care.  . When you arrive at healthcare provider, tell the healthcare staff immediately you have returned from visiting [place], [place], [place], [place] or [place]; or traveled in the [place] to [place], [place], [place], or [place]; in the last two weeks or you have been in close contact with a person diagnosed with COVID-19 in the last 2 weeks.   .  Tell the health care staff about your symptoms: fever, cough and shortness of breath. . After you have been seen by a medical provider, you will be either: o Tested for (COVID-19) and discharged home on quarantine except to seek medical care if symptoms worsen, and asked to  - Stay home and avoid contact with others until you get your results (4-5 days)  - Avoid travel on public transportation if possible (such as bus, train, or airplane) or o Sent to the Emergency Department by EMS for evaluation, COVID-19 testing, and possible admission depending on your condition and test results.  What to do if you are LOW RISK for COVID-19?  Reduce your risk of any infection by using the same precautions used for avoiding the common cold or flu:  Marland Kitchen. Wash your hands often with soap and warm water for at least 20 seconds.  If soap and water are not readily available, use an alcohol-based hand sanitizer with at least 60% alcohol.  . If coughing or sneezing, cover your mouth and nose by coughing or sneezing into the elbow areas of your shirt or coat, into a tissue or into your sleeve (not your hands). . Avoid shaking hands with others and consider head nods or verbal greetings only. . Avoid touching your eyes, nose, or mouth with unwashed hands.  . Avoid close contact with people who are sick. . Avoid places or events with large numbers of people in one location, like concerts or sporting events. . Carefully  consider travel plans you have or are making. . If you are planning any travel outside or inside the [place], visit the CDC's Travelers' Health webpage for the latest health notices. . If you have some symptoms but not all symptoms, continue to monitor at home and seek medical attention if your symptoms worsen. . If you are having a medical emergency, call 911.   ADDITIONAL HEALTHCARE OPTIONS FOR PATIENTS  [facility] / e-Visit: [email or web address]         [facility]: [phone]  [facility]: [phone]                   [facility]: [phone]           It is flu season:   >>> Best ways to protect herself from the flu: Receive the yearly flu vaccine, practice good hand hygiene washing with soap and also using hand sanitizer when available, eat a nutritious meals, get adequate rest, hydrate appropriately   Please contact the office if your symptoms worsen or you have concerns that you are not improving.   Thank you for choosing [facility] for your healthcare, and for allowing [place] to partner with you on your healthcare journey. I am thankful to be able to provide care to you today.   Elisha Headland FNP-C      Sarcoidosis  Sarcoidosis is a disease that can cause inflammation in many areas of the body. It most often affects the lungs (pulmonary sarcoidosis). Sarcoidosis can also affect the lymph nodes, liver, eyes, skin, heart, or any other body tissue. Normally, cells that are part of your body's disease-fighting system (immune system) attack harmful substances (such as germs) in your body. This immune system response causes inflammation. After the harmful substance is destroyed, the inflammation and the immune cells go away. When you have sarcoidosis, your immune system causes inflammation even when there are no harmful substances, and the inflammation does not go away. Sarcoidosis also causes cells from your immune system to form small clumps of tissue (granulomas) in the affected area of your body. What are the causes? The exact cause of sarcoidosis is not known.  It is possible that if you have a family history of this disease (genetic predisposition), the immune system response that leads to inflammation may be triggered by something in your environment, such as:  Bacteria or viruses.  Metals.  Chemicals.  Dust.  Mold or mildew. What increases the risk? You may be  at a greater risk for sarcoidosis if you:  Have a family history of the disease.  Are African-American.  Are of Northern European descent.  Are 39-59 years old.  Work as a IT sales professional.  Work in an environment where you are exposed to metals, chemicals, mold or mildew, or insecticides. What are the signs or symptoms? Some people with sarcoidosis have no symptoms. Others have very mild symptoms. The symptoms usually depend on the organ that is affected. Sarcoidosis most often affects the lungs, which may include symptoms such as:  Chest pain.  Coughing.  Wheezing.  Shortness of breath. Other common symptoms include:  Night sweats.  Fever.  Weight loss.  Fatigue.  Swollen lymph nodes.  Joint pain. How is this diagnosed? Sarcoidosis may be diagnosed based on:  Your symptoms and medical history.  A physical exam.  Imaging tests to check for granulomas such as: ? Chest X-ray. ? CT scan. ? MRI. ? PET scan.  Lung function  tests. These tests evaluate your breathing and check for problems that may be related to sarcoidosis.  A procedure to remove a tissue sample for testing (biopsy). You may have a biopsy of lung tissue if that is where you are having symptoms. You may have tests to check for any complications of the condition. These tests may include:  Eye exams.  MRI of the heart or brain.  Echocardiogram.  Electrocardiogram (EKG or ECG). How is this treated? In some cases, sarcoidosis does not require a specific treatment because it causes no symptoms or mild symptoms. If your symptoms bother you or are severe, you may be prescribed medicines to reduce inflammation or relieve symptoms. These medicines may include:  Prednisone. This is a steroid that reduces inflammation related to sarcoidosis.  Hydroxychloroquine. This may be used to treat sarcoidosis that affects the skin, eyes, or brain.  Methotrexate, leflunomide, or azathioprine. These medicines affect  the immune system and can help with sarcoidosis in the joints, eyes, skin, or lungs.  Medicines that you breathe in (inhalers). Inhalers can help you breathe if sarcoidosis affects your lungs. Follow these instructions at home:   Do not use any products that contain nicotine or tobacco, such as cigarettes and e-cigarettes. If you need help quitting, ask your health care provider.  Avoid secondhand smoke and irritating dust or chemicals. Stay indoors on days when air quality is poor in your area.  Return to your normal activities as told by your health care provider. Ask your health care provider what activities are safe for you.  Take or use over-the-counter and prescription medicines only as told by your health care provider.  Keep all follow-up visits as told by your health care provider. This is important. Contact a health care provider if:  You have vision problems.  You have a dry cough that does not go away.  You have an irregular heartbeat.  You have pain or aches in your joints, hands, or feet.  You have an unexplained rash. Get help right away if:  You have chest pain.  You have difficulty breathing. Summary  Sarcoidosis is a disease that can cause inflammation in many body areas of the body. It most often affects the lungs (pulmonary sarcoidosis). It can also affect the lymph nodes, liver, eyes, skin, heart, or any other body tissue.  When you have sarcoidosis, cells from your immune system form small clumps of tissue (granulomas) in the affected area of your body.  Sarcoidosis sometimes does not require a specific treatment because it causes no symptoms or mild symptoms.  If your symptoms bother you or are severe, you may be prescribed medicines to reduce inflammation or relieve symptoms. This information is not intended to replace advice given to you by your health care provider. Make sure you discuss any questions you have with your health care provider. Document  Released: 02/12/2004 Document Revised: 01/19/2017 Document Reviewed: 01/19/2017 Elsevier Interactive Patient Education  2019 [employer].

## 2018-09-08 NOTE — Assessment & Plan Note (Signed)
Assessment: Transbronchial biopsy consistent with granulomas in April/2019 08/2017 bronchoscopy showing noncaseating granulomas 08/2016 PFTs normal January/2020 chest x-ray shows increased nodular interstitial opacities, opacity right lung base most prominent January/2020 started on Monday Wednesday Friday 5 mg of prednisone Patient is maintained on Monday Wednesday Friday 5 mg prednisone Patient has flared areas on his skin, specifically shins believed to be from sarcoidosis Pending referral to dermatology at this time-Dr. Campbell Stall  Plan: Continue Monday Wednesday Friday 5 mg prednisone daily Schedule appointment with dermatology for further management of skin sarcoid Can continue to use triamcinolone cream and/or Neosporin for management of skin Complete chest x-ray within 1 month to compare to January/2020 chest x-ray 29-month follow-up with Dr. Vassie Loll Continue to exercise daily Notify our office if you have any worsening symptoms or concerns

## 2018-09-11 ENCOUNTER — Other Ambulatory Visit: Payer: Self-pay | Admitting: Cardiovascular Disease

## 2018-09-11 DIAGNOSIS — E785 Hyperlipidemia, unspecified: Secondary | ICD-10-CM

## 2018-09-14 ENCOUNTER — Ambulatory Visit: Payer: BLUE CROSS/BLUE SHIELD | Admitting: Pulmonary Disease

## 2018-09-22 IMAGING — DX DG CHEST 2V
2 series · 2 of 2 positions shown · non-contrast
Comparison: Chest radiograph September 08, 2017 and chest CT September 06, 2017

CLINICAL DATA: Sarcoidosis.

EXAM:
CHEST - 2 VIEW

[chest pa]
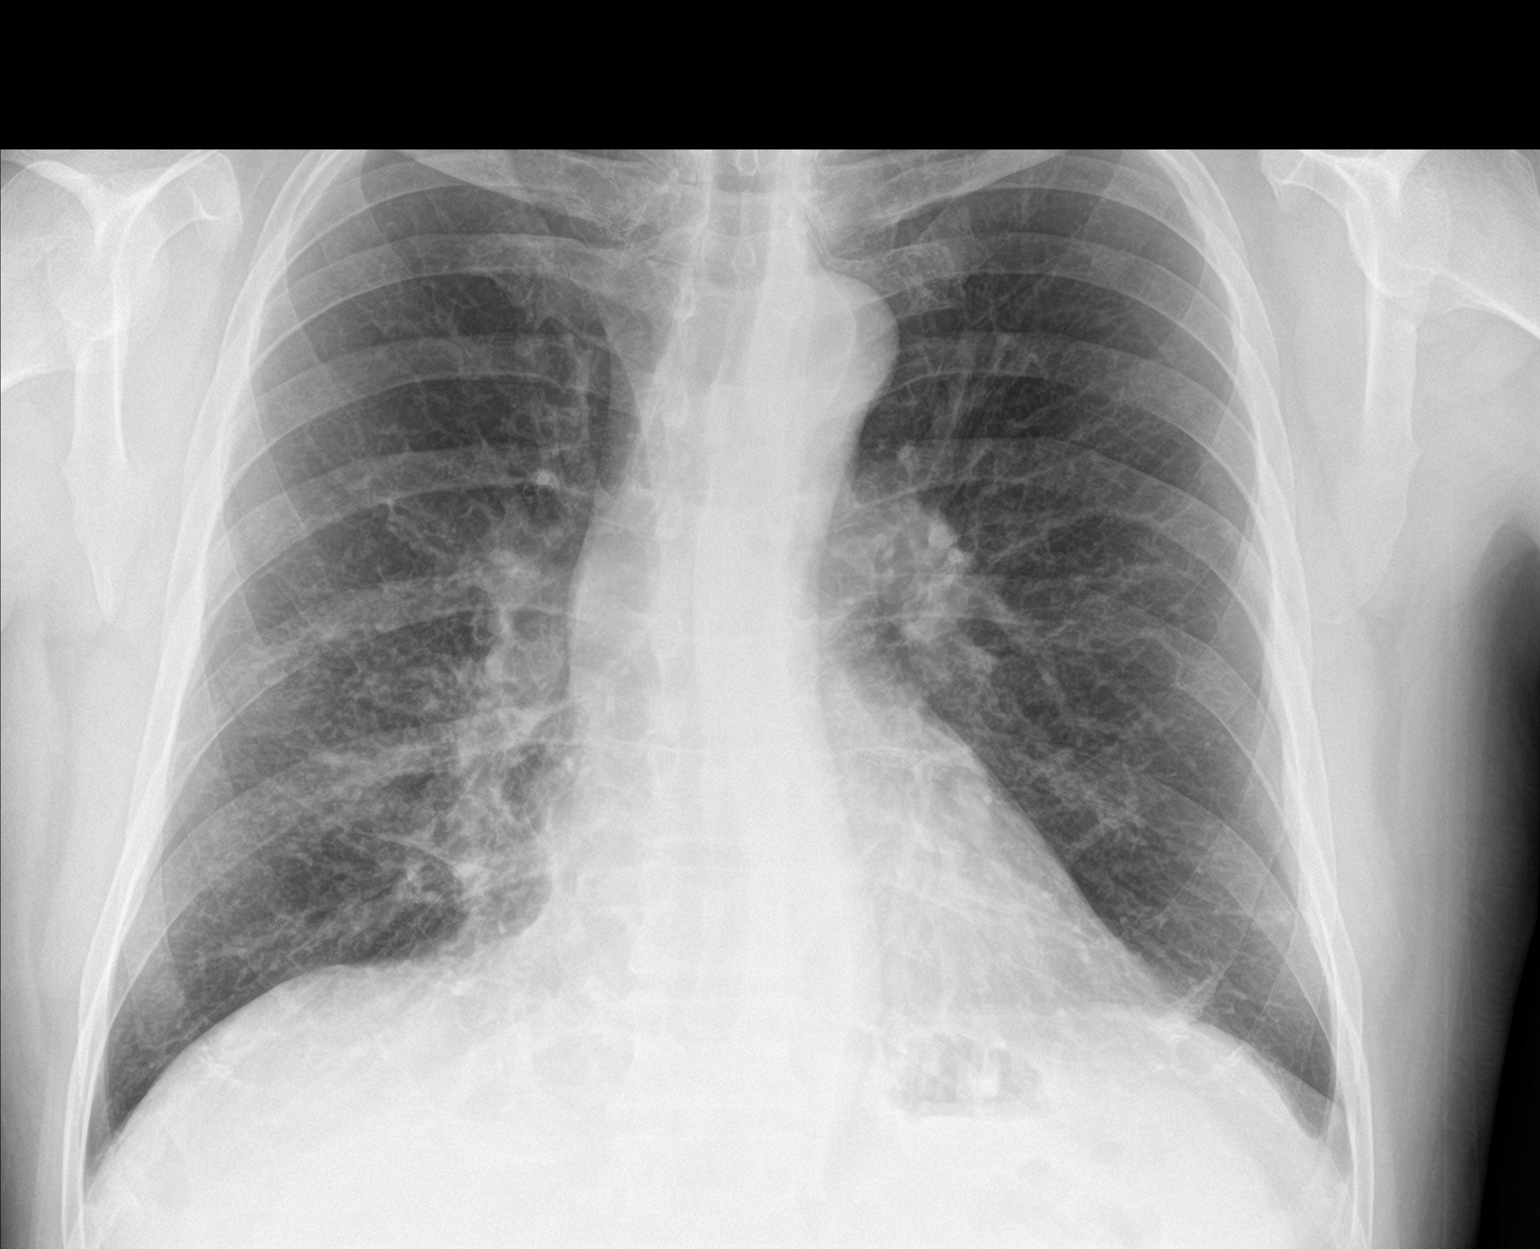

[chest lat]
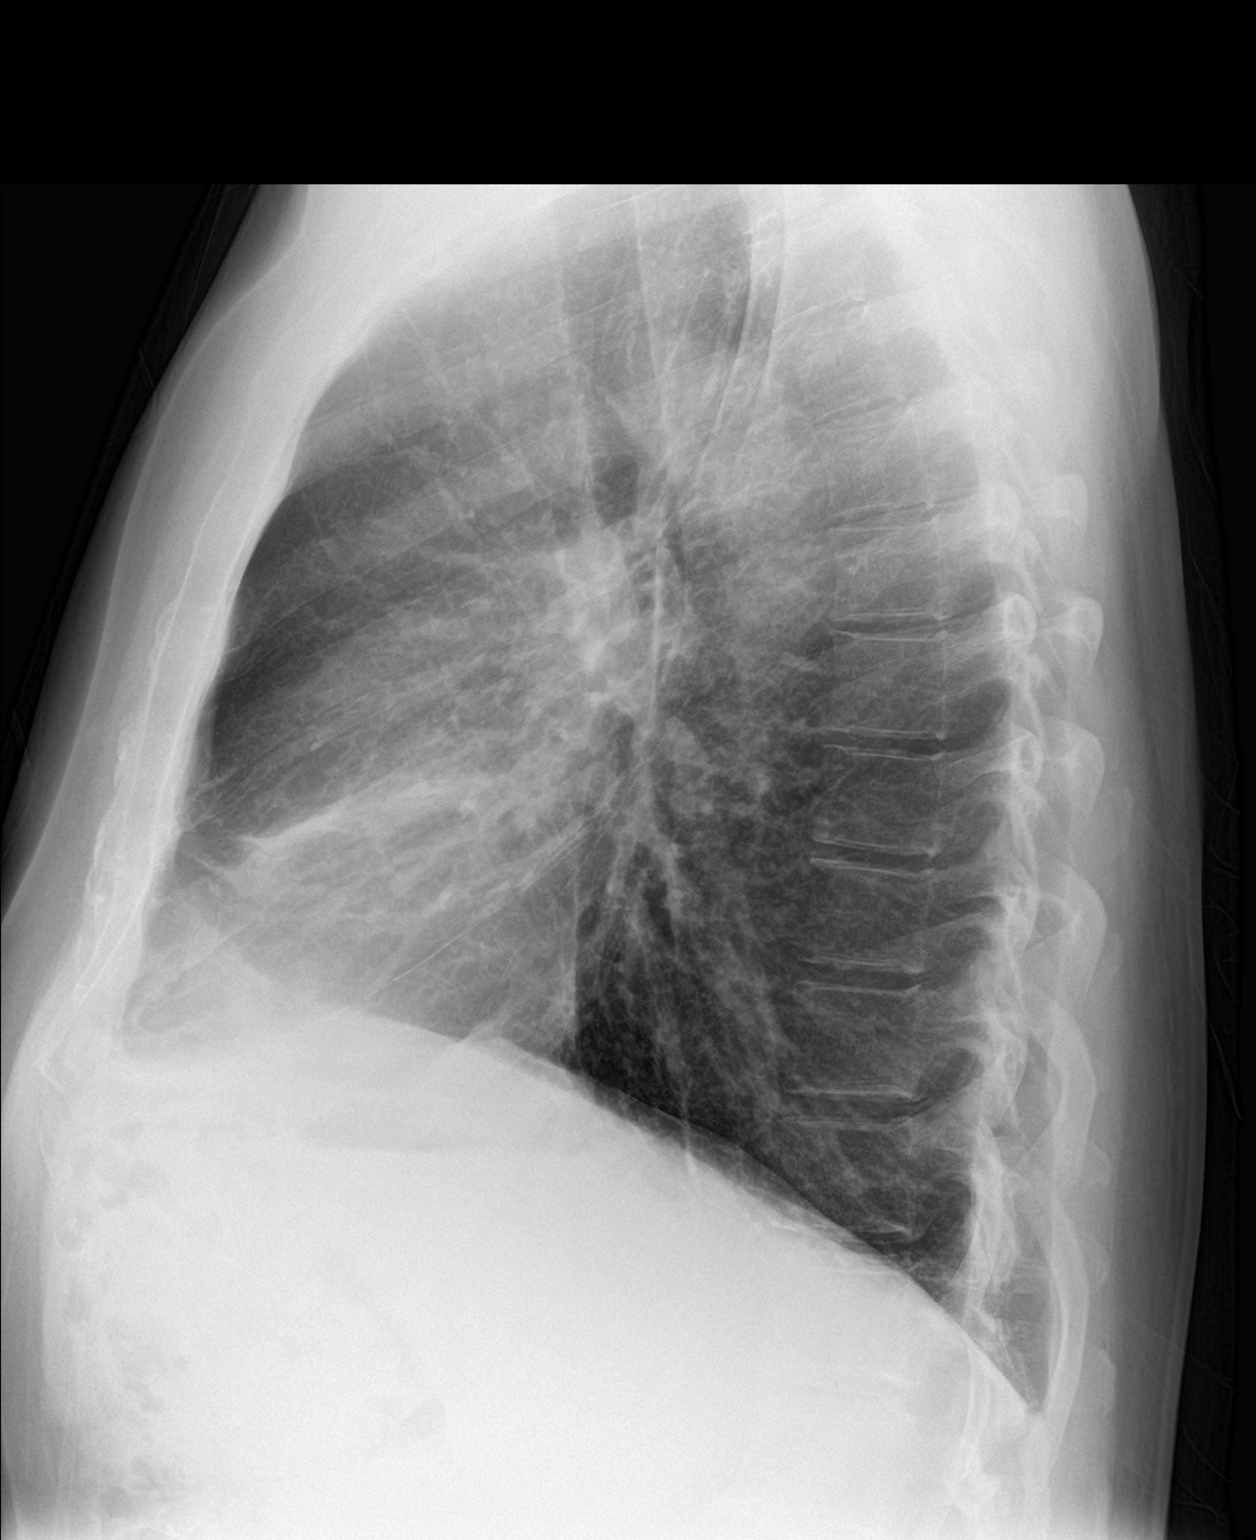

[2 of 2 positions shown; findings below may reference images not displayed]

FINDINGS: Fibrotic type changes noted throughout portions of the right mid and
lower lung zones as well as to a lesser degree in the left lower
lung zone. Previous consolidation in the right middle lobe has
largely resolved. No new opacity is evident.

Heart size is normal. No adenopathy is appreciable by radiography.
No bone lesions.
IMPRESSION: Significant interval clearing of right middle lobe infiltrate. A
small amount of patchy opacity remains in this area, potentially
representing scarring. Areas of underlying fibrotic change,
primarily in the right mid lower lung zones, is also present,
findings consistent with history of sarcoidosis.

No new opacity evident. Heart size normal. No adenopathy is
appreciable by radiography. Note that several mildly enlarged lymph
nodes seen on CT recently could well be present but difficult to
visualize by radiography.

## 2019-04-29 IMAGING — DX DG CHEST 2V
2 series · 2 of 2 positions shown · non-contrast
Comparison: February 17, 2018

CLINICAL DATA: Sarcoidosis

EXAM:
CHEST - 2 VIEW

[chest pa]
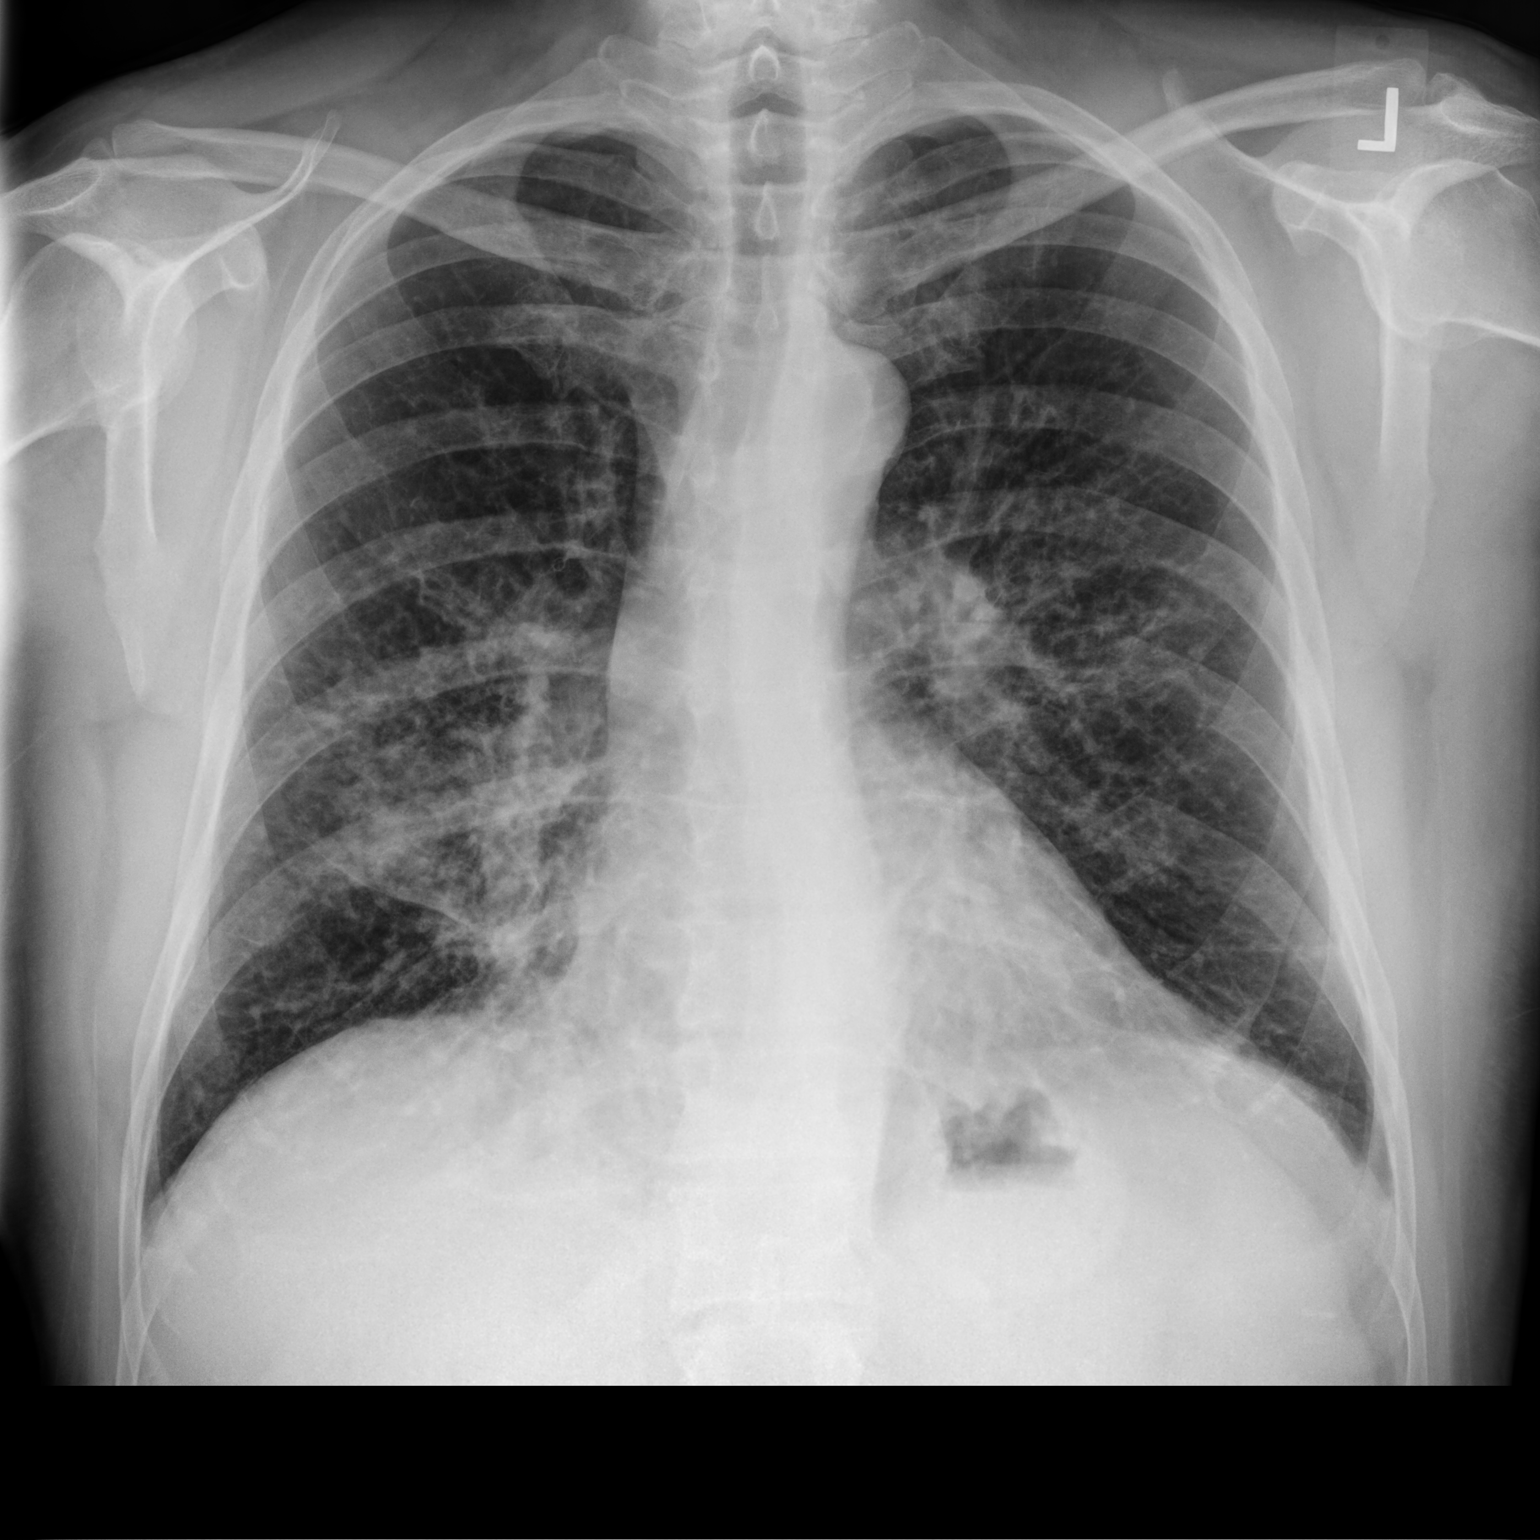

[chest lat]
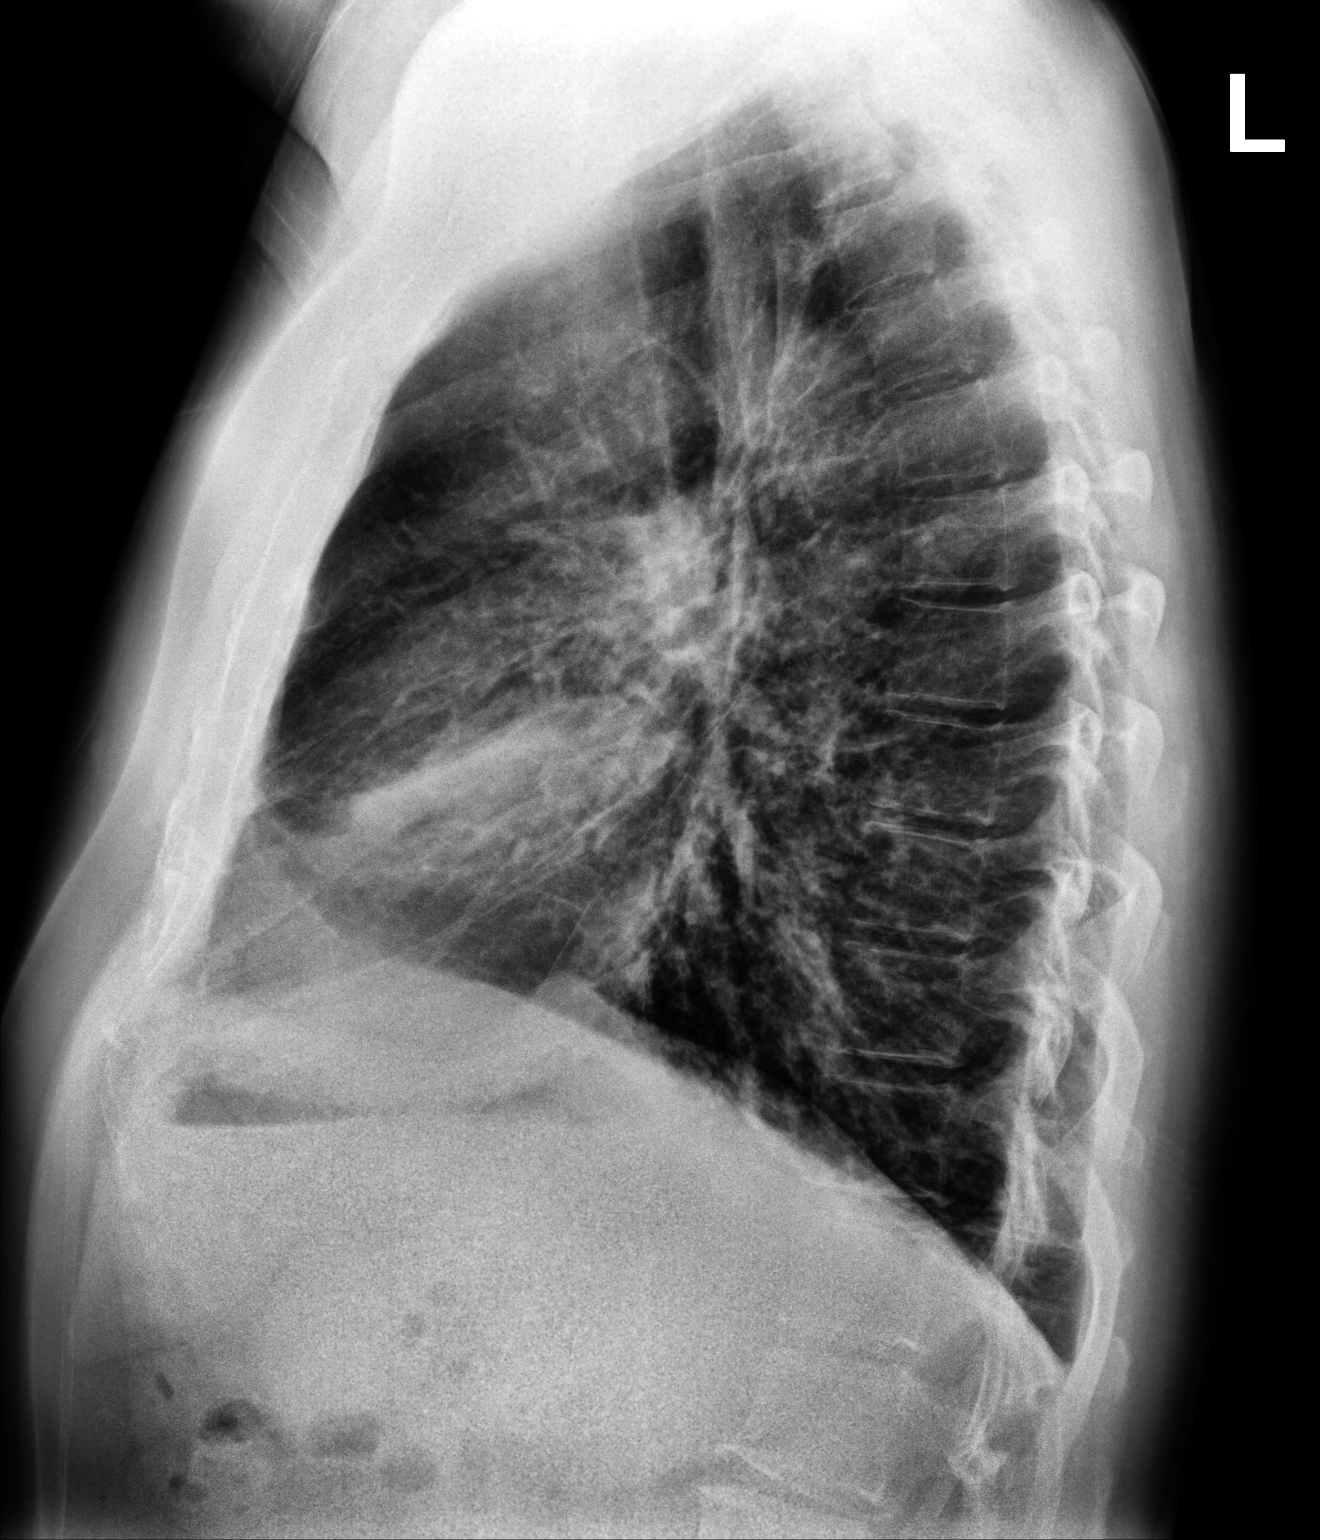

[2 of 2 positions shown; findings below may reference images not displayed]

FINDINGS: The heart size and mediastinal contours are within normal limits.
Chronic increased nodular and interstitial opacities are identified
in the bilateral perihilar region and in right lung base. The
opacity of the right lung base slightly more prominent compared to
prior exam, superimposed pneumonia is not excluded. There is no
pleural effusion. The visualized skeletal structures are stable.
IMPRESSION: Chronic increased nodular and interstitial opacities are identified
in the bilateral perihilar region and in right lung base. The
opacity of the right lung base slightly more prominent compared to
prior exam, superimposed pneumonia is not excluded.

## 2019-09-18 ENCOUNTER — Other Ambulatory Visit: Payer: Self-pay | Admitting: Cardiovascular Disease

## 2019-09-18 DIAGNOSIS — E785 Hyperlipidemia, unspecified: Secondary | ICD-10-CM

## 2019-09-20 ENCOUNTER — Other Ambulatory Visit: Payer: Self-pay

## 2019-09-20 DIAGNOSIS — E785 Hyperlipidemia, unspecified: Secondary | ICD-10-CM

## 2019-09-20 MED ORDER — ATORVASTATIN CALCIUM 80 MG PO TABS: ORAL_TABLET | ORAL | 1 refills | Status: DC

## 2020-03-20 ENCOUNTER — Other Ambulatory Visit: Payer: Self-pay | Admitting: Cardiovascular Disease

## 2020-03-20 DIAGNOSIS — E785 Hyperlipidemia, unspecified: Secondary | ICD-10-CM

## 2020-06-21 ENCOUNTER — Other Ambulatory Visit: Payer: Self-pay | Admitting: Cardiovascular Disease

## 2020-06-21 DIAGNOSIS — E785 Hyperlipidemia, unspecified: Secondary | ICD-10-CM

## 2020-07-31 ENCOUNTER — Telehealth: Payer: Self-pay | Admitting: Cardiovascular Disease

## 2020-07-31 DIAGNOSIS — E785 Hyperlipidemia, unspecified: Secondary | ICD-10-CM

## 2020-07-31 MED ORDER — ATORVASTATIN CALCIUM 80 MG PO TABS: ORAL_TABLET | ORAL | 1 refills | Status: DC

## 2020-07-31 NOTE — Telephone Encounter (Signed)
*  STAT* If patient is at the pharmacy, call can be transferred to refill team.   1. Which medications need to be refilled? (please list name of each medication and dose if known)  atorvastatin (LIPITOR) 80 MG tablet  2. Which pharmacy/location (including street and city if local pharmacy) is medication to be sent to? [id number] [place], [place] - [address]  3. Do they need a 30 day or 90 day supply?   Patient's wife states the patient has a 1-2 week supply remaining. She is requesting enough to hold him over until appointment on 09/13/20 with Dr. Allyson Sabal.

## 2020-09-13 ENCOUNTER — Other Ambulatory Visit: Payer: Self-pay

## 2020-09-13 ENCOUNTER — Ambulatory Visit (INDEPENDENT_AMBULATORY_CARE_PROVIDER_SITE_OTHER): Payer: No Typology Code available for payment source | Admitting: Cardiovascular Disease

## 2020-09-13 ENCOUNTER — Encounter: Payer: Self-pay | Admitting: Cardiovascular Disease

## 2020-09-13 DIAGNOSIS — I2583 Coronary atherosclerosis due to lipid rich plaque: Secondary | ICD-10-CM

## 2020-09-13 DIAGNOSIS — E782 Mixed hyperlipidemia: Secondary | ICD-10-CM

## 2020-09-13 DIAGNOSIS — I2510 Atherosclerotic heart disease of native coronary artery without angina pectoris: Secondary | ICD-10-CM

## 2020-09-13 NOTE — Progress Notes (Signed)
09/13/2020 Joseph Valencia.   May 11, 1965  [id number]  Primary Physician Marden Noble, MD Primary Cardiologist: Runell Gess MD Milagros Loll, [place], [place]  HPI:  Joseph Valencia. is a 55 y.o.  mildly overweight married Caucasian male father of a24 year old son who recently had a child. He was working as an Patent examiner for [employer].  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  I last saw him 5/16/2018He was referred by Dr. Johnella Moloney for cardiovascular evaluation because of exertional left upper extremity pain and an abnormal EKG. Factors include a strong family history of heart disease the father died of an MI 76, maternal uncle and grandfather all had heart disease. He has never had artificial. His only risk factor is family history. 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 The pain is reproducible and is without exertional. His EKG shows Q waves V1 through V4 suggesting anteroseptal myocardial function.I performed cardiac catheterization on him 07/03/13 revealing a short segment occlusion of the mid LAD with grade 2 right-to-left collaterals. I was ultimately able to percutaneously revascularize him with overlapping drug-eluting stents. He's done well postoperatively without recurrent symptoms. The procedure was complicated by an iatrogenic right radial AV fistula which was demonstrated by ultrasound but from which he is asymptomatic. He walks 3-7 miles a day with his wife without symptoms. He does complain of excessive bruising probably related to his dual antiplatelet therapy on Brilenta and he was since switched to plavix.Since I saw him a year ago he remained completely asymptomatic.  Since I saw him 3 years ago he continues to do well.  He denies chest pain or shortness of breath.  He does enjoy going out to [employer].   Current Meds  Medication Sig  . aspirin 81 MG tablet Take 81 mg by mouth daily.  Marland Kitchen  atorvastatin (LIPITOR) 80 MG tablet TAKE 1/2 TABLET BY MOUTH EVERY DAY AT 6PM NEEDS APPT FOR REFILLS  . Cholecalciferol (VITAMIN D3) 5000 units CAPS Take 5,000 Units by mouth daily.   . Coenzyme Q10 300 MG CAPS Take 300 mg by mouth daily.   . folic acid (FOLVITE) 800 MCG tablet Take 800 mcg by mouth daily.   Stephanie Acre (GLUCOSAMINE MSM COMPLEX PO) Take 1,500 mg by mouth daily.   . Multiple Vitamins-Minerals (MULTIVITAMIN WITH MINERALS) tablet Take 1 tablet by mouth daily.  . nitroGLYCERIN (NITROSTAT) 0.4 MG SL tablet Place 1 tablet (0.4 mg total) under the tongue every 5 (five) minutes as needed for chest pain.  Marland Kitchen omeprazole (PRILOSEC OTC) 20 MG tablet Take 20 mg by mouth every Monday, Wednesday, and Friday.  . trolamine salicylate (ASPERCREME) 10 % cream Apply 1 application topically as needed for muscle pain.  . vitamin B-12 (CYANOCOBALAMIN) 1000 MCG tablet Take 1,000 mcg by mouth daily.     No Known Allergies  Social History   Socioeconomic History  . Marital status: Married    Spouse name: Not on file  . Number of children: Not on file  . Years of education: Not on file  . Highest education level: Not on file  Occupational History  . Not on file  Tobacco Use  . Smoking status: Never Smoker  . Smokeless tobacco: Never Used  Substance and Sexual Activity  . Alcohol use: Yes    Comment: OCCASIONAL WINE  . Drug use: No  . Sexual activity: Not on file  Other Topics  Concern  . Not on file  Social History Narrative  . Not on file   Social Determinants of Health   Financial Resource Strain: Not on file  Food Insecurity: Not on file  Transportation Needs: Not on file  Physical Activity: Not on file  Stress: Not on file  Social Connections: Not on file  Intimate Partner Violence: Not on file     Review of Systems: General: negative for chills, fever, night sweats or weight changes.  Cardiovascular: negative for chest pain, dyspnea on exertion, edema,  orthopnea, palpitations, paroxysmal nocturnal dyspnea or shortness of breath Dermatological: negative for rash Respiratory: negative for cough or wheezing Urologic: negative for hematuria Abdominal: negative for nausea, vomiting, diarrhea, bright red blood per rectum, melena, or hematemesis Neurologic: negative for visual changes, syncope, or dizziness All other systems reviewed and are otherwise negative except as noted above.    Blood pressure 124/81, pulse 70, height 6' (1.829 m), weight 239 lb 9.6 oz (108.7 kg), SpO2 93 %.  General appearance: alert and no distress Neck: no adenopathy, no carotid bruit, no JVD, supple, symmetrical, trachea midline and thyroid not enlarged, symmetric, no tenderness/mass/nodules Lungs: clear to auscultation bilaterally Heart: regular rate and rhythm, S1, S2 normal, no murmur, click, rub or gallop Extremities: extremities normal, atraumatic, no cyanosis or edema Pulses: 2+ and symmetric Skin: Skin color, texture, turgor normal. No rashes or lesions Neurologic: Alert and oriented X 3, normal strength and tone. Normal symmetric reflexes. Normal coordination and gait  EKG sinus rhythm at 70 without ST or T wave changes.  Personally reviewed this EKG.  ASSESSMENT AND PLAN:   CAD (coronary artery disease) History of CAD status post cardiac catheterization performed by myself 07/03/2013 via right radial approach revealing a short segment occlusion of the mid LAD with grade 2 right to left collaterals.  I was ultimately able to percutaneously revascularize him with overlapping drug-eluting stents.  He did well postoperatively.  Is completely asymptomatic.  He did develop an asymptomatic right radial AV fistula which subsequently resolved.  He remains on baby aspirin therapy.  Hyperlipidemia History of hyperlipidemia on statin therapy with lipid profile performed 03/28/2020 revealing total cholesterol 159, LDL 78 and HDL 53.      Runell Gess MD  [facility] 09/13/2020 10:12 AM

## 2020-09-13 NOTE — Assessment & Plan Note (Signed)
History of hyperlipidemia on statin therapy with lipid profile performed 03/28/2020 revealing total cholesterol 159, LDL 78 and HDL 53.

## 2020-09-13 NOTE — Patient Instructions (Signed)

## 2020-09-13 NOTE — Assessment & Plan Note (Signed)
History of CAD status post cardiac catheterization performed by myself 07/03/2013 via right radial approach revealing a short segment occlusion of the mid LAD with grade 2 right to left collaterals.  I was ultimately able to percutaneously revascularize him with overlapping drug-eluting stents.  He did well postoperatively.  Is completely asymptomatic.  He did develop an asymptomatic right radial AV fistula which subsequently resolved.  He remains on baby aspirin therapy.

## 2020-12-31 NOTE — Progress Notes (Signed)
Order(s) created erroneously. Erroneous order ID: [id number]  Order moved by: Jimmy Footman  Order move date/time: 12/31/2020 9:03 AM  Source Patient: [id number]  Source Contact: 08/11/2027  Destination Patient: [id number]  Destination Contact: 08/10/2017

## 2020-12-31 NOTE — Progress Notes (Signed)
Order(s) created erroneously. Erroneous order ID: [id number]  Order moved by: MULLER, ALICIA N  Order move date/time: 12/31/2020 9:03 AM  Source Patient: [id number]  Source Contact: 08/11/2027  Destination Patient: [id number]  Destination Contact: 08/10/2017 

## 2021-02-27 ENCOUNTER — Other Ambulatory Visit: Payer: Self-pay | Admitting: Cardiovascular Disease

## 2021-02-27 DIAGNOSIS — E785 Hyperlipidemia, unspecified: Secondary | ICD-10-CM

## 2021-04-30 DIAGNOSIS — R946 Abnormal results of thyroid function studies: Secondary | ICD-10-CM | POA: Diagnosis not present

## 2021-07-04 ENCOUNTER — Ambulatory Visit
Admission: RE | Admit: 2021-07-04 | Discharge: 2021-07-04 | Disposition: A | Discharge status: Home / Self Care | Payer: BC Managed Care – PPO | Source: Ambulatory Visit | Attending: Internal Medicine | Admitting: Internal Medicine

## 2021-07-04 ENCOUNTER — Other Ambulatory Visit: Payer: Self-pay | Admitting: Internal Medicine

## 2021-07-04 DIAGNOSIS — R61 Generalized hyperhidrosis: Secondary | ICD-10-CM | POA: Diagnosis not present

## 2021-07-04 DIAGNOSIS — R059 Cough, unspecified: Secondary | ICD-10-CM | POA: Diagnosis not present

## 2021-07-04 DIAGNOSIS — J9801 Acute bronchospasm: Secondary | ICD-10-CM | POA: Diagnosis not present

## 2021-07-14 ENCOUNTER — Ambulatory Visit
Admission: RE | Admit: 2021-07-14 | Discharge: 2021-07-14 | Disposition: A | Discharge status: Home / Self Care | Payer: BC Managed Care – PPO | Source: Ambulatory Visit | Attending: Internal Medicine | Admitting: Internal Medicine

## 2021-07-14 ENCOUNTER — Other Ambulatory Visit: Payer: Self-pay | Admitting: Internal Medicine

## 2021-07-14 DIAGNOSIS — Z8701 Personal history of pneumonia (recurrent): Secondary | ICD-10-CM | POA: Diagnosis not present

## 2021-07-14 DIAGNOSIS — J189 Pneumonia, unspecified organism: Secondary | ICD-10-CM

## 2021-07-14 DIAGNOSIS — R946 Abnormal results of thyroid function studies: Secondary | ICD-10-CM | POA: Diagnosis not present

## 2021-07-14 DIAGNOSIS — R059 Cough, unspecified: Secondary | ICD-10-CM | POA: Diagnosis not present

## 2021-07-14 DIAGNOSIS — R61 Generalized hyperhidrosis: Secondary | ICD-10-CM | POA: Diagnosis not present

## 2021-07-14 DIAGNOSIS — J9801 Acute bronchospasm: Secondary | ICD-10-CM | POA: Diagnosis not present

## 2022-01-27 DIAGNOSIS — C44519 Basal cell carcinoma of skin of other part of trunk: Secondary | ICD-10-CM | POA: Diagnosis not present

## 2022-01-30 ENCOUNTER — Encounter: Payer: Self-pay | Admitting: Cardiovascular Disease

## 2022-01-30 ENCOUNTER — Ambulatory Visit: Payer: BC Managed Care – PPO | Attending: Cardiovascular Disease | Admitting: Cardiovascular Disease

## 2022-01-30 VITALS — BP 118/80 | HR 71 | Ht 73.0 in | Wt 238.8 lb

## 2022-01-30 DIAGNOSIS — E782 Mixed hyperlipidemia: Secondary | ICD-10-CM | POA: Diagnosis not present

## 2022-01-30 DIAGNOSIS — I2583 Coronary atherosclerosis due to lipid rich plaque: Secondary | ICD-10-CM | POA: Diagnosis not present

## 2022-01-30 DIAGNOSIS — I2510 Atherosclerotic heart disease of native coronary artery without angina pectoris: Secondary | ICD-10-CM | POA: Diagnosis not present

## 2022-01-30 NOTE — Assessment & Plan Note (Signed)
History of hyperlipidemia on high-dose statin therapy with lipid profile performed 03/28/2020 revealing total cholesterol of 159, LDL of 70 and HDL 53.

## 2022-01-30 NOTE — Progress Notes (Signed)
01/30/2022 Joseph Valencia.   13-Jun-1965  [id number]  Primary Physician Joseph Noble, MD Primary Cardiologist: Joseph Gess MD Joseph Valencia, [place], [place]  HPI:  Joseph Valencia. is a 56 y.o.    mildly overweight married Caucasian male father of a 83 year old son who recently had a child. He was working as an Patent examiner for [employer].  He since has changed jobs and now works for [employer] which is a spin off from [employer].  He works on the engagement side.  Since changing jobs, he is much less stressed out and feels personally improved..  I last saw him 09/13/2020 he was referred by Dr. Johnella Valencia for cardiovascular evaluation because of exertional left upper extremity pain and an abnormal EKG. Factors include a strong family history of heart disease the father died of an MI 22, maternal uncle and grandfather all had heart disease. He has never had artificial. His only risk factor is family history. 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 The pain is reproducible and is without exertional. His EKG shows Q waves V1 through V4 suggesting anteroseptal myocardial function.I performed cardiac catheterization on him 07/03/13 revealing a short segment occlusion of the mid LAD with grade 2 right-to-left collaterals. I was ultimately able to percutaneously revascularize him with overlapping drug-eluting stents. He's done well postoperatively without recurrent symptoms. The procedure was complicated by an iatrogenic right radial AV fistula which was demonstrated by ultrasound but from which he is asymptomatic. He walks 3-7 miles a day with his wife without symptoms. He does complain of excessive bruising probably related to his dual antiplatelet therapy on Brilenta and he was since switched to plavix. Since I saw him a year ago he remained completely asymptomatic.   Since I saw him 1-1/2 years ago he continues to do well.  He denies chest pain or shortness of breath.  He works out  frequently often walking twice a day with his wife.  Current Meds  Medication Sig   aspirin 81 MG tablet Take 81 mg by mouth daily.   atorvastatin (LIPITOR) 80 MG tablet TAKE 1/2 TABLET BY MOUTH EVERY DAY AT 6PM NEEDS APPT FOR REFILLS   Cholecalciferol (VITAMIN D3) 5000 units CAPS Take 5,000 Units by mouth daily.    Coenzyme Q10 300 MG CAPS Take 300 mg by mouth daily.    folic acid (FOLVITE) 800 MCG tablet Take 800 mcg by mouth daily.    Glucos-MSM-C-Mn-Ginger-Willow (GLUCOSAMINE MSM COMPLEX PO) Take 1,500 mg by mouth daily.    Multiple Vitamins-Minerals (MULTIVITAMIN WITH MINERALS) tablet Take 1 tablet by mouth daily.   nitroGLYCERIN (NITROSTAT) 0.4 MG SL tablet Place 1 tablet (0.4 mg total) under the tongue every 5 (five) minutes as needed for chest pain.   omeprazole (PRILOSEC OTC) 20 MG tablet Take 20 mg by mouth every Monday, Wednesday, and Friday.   trolamine salicylate (ASPERCREME) 10 % cream Apply 1 application topically as needed for muscle pain.   vitamin B-12 (CYANOCOBALAMIN) 1000 MCG tablet Take 1,000 mcg by mouth daily.   [DISCONTINUED] levofloxacin (LEVAQUIN) 500 MG tablet      No Known Allergies  Social History   Socioeconomic History   Marital status: Married    Spouse name: Not on file   Number of children: Not on file   Years of education: Not on file   Highest education level: Not on file  Occupational History   Not on file  Tobacco Use   Smoking  status: Never   Smokeless tobacco: Never  Substance and Sexual Activity   Alcohol use: Yes    Comment: OCCASIONAL WINE   Drug use: No   Sexual activity: Not on file  Other Topics Concern   Not on file  Social History Narrative   Not on file   Social Determinants of Health   Financial Resource Strain: Not on file  Food Insecurity: Not on file  Transportation Needs: Not on file  Physical Activity: Not on file  Stress: Not on file  Social Connections: Not on file  Intimate Partner Violence: Not on file      Review of Systems: General: negative for chills, fever, night sweats or weight changes.  Cardiovascular: negative for chest pain, dyspnea on exertion, edema, orthopnea, palpitations, paroxysmal nocturnal dyspnea or shortness of breath Dermatological: negative for rash Respiratory: negative for cough or wheezing Urologic: negative for hematuria Abdominal: negative for nausea, vomiting, diarrhea, bright red blood per rectum, melena, or hematemesis Neurologic: negative for visual changes, syncope, or dizziness All other systems reviewed and are otherwise negative except as noted above.    Blood pressure 118/80, pulse 71, height 6\' 1"  (1.854 m), weight 238 lb 12.8 oz (108.3 kg), SpO2 98 %.  General appearance: alert and no distress Neck: no adenopathy, no carotid bruit, no JVD, supple, symmetrical, trachea midline, and thyroid not enlarged, symmetric, no tenderness/mass/nodules Lungs: clear to auscultation bilaterally Heart: regular rate and rhythm, S1, S2 normal, no murmur, click, rub or gallop Extremities: extremities normal, atraumatic, no cyanosis or edema Pulses: 2+ and symmetric Skin: Skin color, texture, turgor normal. No rashes or lesions Neurologic: Grossly normal  EKG sinus rhythm at 71 with small inferior Q waves but otherwise normal EKG.  I personally reviewed this EKG.  ASSESSMENT AND PLAN:   CAD (coronary artery disease) History of CAD status post cardiac catheterization by myself 07/03/2013 revealing short segment occlusion mid LAD with grade 2 right to left collaterals.  I was ultimately able to percutaneously revascularize him with 2 overlapping drug-eluting stents.  He has done well since without symptoms.  He did have an asymptomatic iatrogenic right radial artery AV fistula however.  He remains on low-dose aspirin.  Hyperlipidemia History of hyperlipidemia on high-dose statin therapy with lipid profile performed 03/28/2020 revealing total cholesterol of 159, LDL of 70  and HDL 53.     Joseph Harp MD Joseph Valencia, Joseph Valencia 01/30/2022 8:38 AM

## 2022-01-30 NOTE — Assessment & Plan Note (Signed)
History of CAD status post cardiac catheterization by myself 07/03/2013 revealing short segment occlusion mid LAD with grade 2 right to left collaterals.  I was ultimately able to percutaneously revascularize him with 2 overlapping drug-eluting stents.  He has done well since without symptoms.  He did have an asymptomatic iatrogenic right radial artery AV fistula however.  He remains on low-dose aspirin.

## 2022-01-30 NOTE — Patient Instructions (Signed)
Medication Instructions:  Your physician recommends that you continue on your current medications as directed. Please refer to the Current Medication list given to you today.  *If you need a refill on your cardiac medications before your next appointment, please call your pharmacy*   Follow-Up: At [facility], you and your health needs are our priority.  As part of our continuing mission to provide you with exceptional heart care, we have created designated Provider Care Teams.  These Care Teams include your primary Cardiologist (physician) and Advanced Practice Providers (APPs -  Physician Assistants and Nurse Practitioners) who all work together to provide you with the care you need, when you need it.  We recommend signing up for the patient portal called "MyChart".  Sign up information is provided on this After Visit Summary.  MyChart is used to connect with patients for Virtual Visits (Telemedicine).  Patients are able to view lab/test results, encounter notes, upcoming appointments, etc.  Non-urgent messages can be sent to your provider as well.   To learn more about what you can do with MyChart, go to [email or web address].    Your next appointment:   12 month(s)  The format for your next appointment:   In Person  Provider:   Jonathan Berry, MD   

## 2022-02-12 DIAGNOSIS — C4441 Basal cell carcinoma of skin of scalp and neck: Secondary | ICD-10-CM | POA: Diagnosis not present

## 2022-03-13 DIAGNOSIS — C44519 Basal cell carcinoma of skin of other part of trunk: Secondary | ICD-10-CM | POA: Diagnosis not present

## 2022-03-13 DIAGNOSIS — L905 Scar conditions and fibrosis of skin: Secondary | ICD-10-CM | POA: Diagnosis not present

## 2022-04-17 ENCOUNTER — Other Ambulatory Visit: Payer: Self-pay | Admitting: Cardiovascular Disease

## 2022-04-17 DIAGNOSIS — E785 Hyperlipidemia, unspecified: Secondary | ICD-10-CM

## 2022-04-17 DIAGNOSIS — L905 Scar conditions and fibrosis of skin: Secondary | ICD-10-CM | POA: Diagnosis not present

## 2022-04-17 DIAGNOSIS — C44519 Basal cell carcinoma of skin of other part of trunk: Secondary | ICD-10-CM | POA: Diagnosis not present

## 2022-06-10 DIAGNOSIS — R946 Abnormal results of thyroid function studies: Secondary | ICD-10-CM | POA: Diagnosis not present

## 2022-06-10 DIAGNOSIS — D708 Other neutropenia: Secondary | ICD-10-CM | POA: Diagnosis not present

## 2022-06-10 DIAGNOSIS — E559 Vitamin D deficiency, unspecified: Secondary | ICD-10-CM | POA: Diagnosis not present

## 2022-06-10 DIAGNOSIS — R7989 Other specified abnormal findings of blood chemistry: Secondary | ICD-10-CM | POA: Diagnosis not present

## 2022-06-10 DIAGNOSIS — Z0000 Encounter for general adult medical examination without abnormal findings: Secondary | ICD-10-CM | POA: Diagnosis not present

## 2022-06-10 DIAGNOSIS — R350 Frequency of micturition: Secondary | ICD-10-CM | POA: Diagnosis not present

## 2022-06-10 DIAGNOSIS — Z125 Encounter for screening for malignant neoplasm of prostate: Secondary | ICD-10-CM | POA: Diagnosis not present

## 2022-06-22 DIAGNOSIS — Z1211 Encounter for screening for malignant neoplasm of colon: Secondary | ICD-10-CM | POA: Diagnosis not present

## 2022-06-22 DIAGNOSIS — Z1212 Encounter for screening for malignant neoplasm of rectum: Secondary | ICD-10-CM | POA: Diagnosis not present

## 2022-07-27 DIAGNOSIS — E039 Hypothyroidism, unspecified: Secondary | ICD-10-CM | POA: Diagnosis not present

## 2022-07-29 DIAGNOSIS — L298 Other pruritus: Secondary | ICD-10-CM | POA: Diagnosis not present

## 2022-07-29 DIAGNOSIS — Z789 Other specified health status: Secondary | ICD-10-CM | POA: Diagnosis not present

## 2022-07-29 DIAGNOSIS — R208 Other disturbances of skin sensation: Secondary | ICD-10-CM | POA: Diagnosis not present

## 2022-07-29 DIAGNOSIS — Z85828 Personal history of other malignant neoplasm of skin: Secondary | ICD-10-CM | POA: Diagnosis not present

## 2022-07-29 DIAGNOSIS — D485 Neoplasm of uncertain behavior of skin: Secondary | ICD-10-CM | POA: Diagnosis not present

## 2022-07-29 DIAGNOSIS — L820 Inflamed seborrheic keratosis: Secondary | ICD-10-CM | POA: Diagnosis not present

## 2022-07-29 DIAGNOSIS — C4339 Malignant melanoma of other parts of face: Secondary | ICD-10-CM | POA: Diagnosis not present

## 2022-07-29 DIAGNOSIS — Z85820 Personal history of malignant melanoma of skin: Secondary | ICD-10-CM | POA: Diagnosis not present

## 2022-07-29 DIAGNOSIS — C44329 Squamous cell carcinoma of skin of other parts of face: Secondary | ICD-10-CM | POA: Diagnosis not present

## 2022-07-29 DIAGNOSIS — Z08 Encounter for follow-up examination after completed treatment for malignant neoplasm: Secondary | ICD-10-CM | POA: Diagnosis not present

## 2022-07-29 DIAGNOSIS — C44519 Basal cell carcinoma of skin of other part of trunk: Secondary | ICD-10-CM | POA: Diagnosis not present

## 2022-08-31 DIAGNOSIS — E039 Hypothyroidism, unspecified: Secondary | ICD-10-CM | POA: Diagnosis not present

## 2022-09-02 DIAGNOSIS — D0439 Carcinoma in situ of skin of other parts of face: Secondary | ICD-10-CM | POA: Diagnosis not present

## 2022-09-16 DIAGNOSIS — C44519 Basal cell carcinoma of skin of other part of trunk: Secondary | ICD-10-CM | POA: Diagnosis not present

## 2022-10-16 DIAGNOSIS — E039 Hypothyroidism, unspecified: Secondary | ICD-10-CM | POA: Diagnosis not present

## 2022-10-25 ENCOUNTER — Other Ambulatory Visit: Payer: Self-pay | Admitting: Cardiovascular Disease

## 2022-10-25 DIAGNOSIS — E785 Hyperlipidemia, unspecified: Secondary | ICD-10-CM

## 2022-12-02 DIAGNOSIS — E039 Hypothyroidism, unspecified: Secondary | ICD-10-CM | POA: Diagnosis not present

## 2023-02-26 DIAGNOSIS — D0471 Carcinoma in situ of skin of right lower limb, including hip: Secondary | ICD-10-CM | POA: Diagnosis not present

## 2023-02-26 DIAGNOSIS — D485 Neoplasm of uncertain behavior of skin: Secondary | ICD-10-CM | POA: Diagnosis not present

## 2023-02-26 DIAGNOSIS — L578 Other skin changes due to chronic exposure to nonionizing radiation: Secondary | ICD-10-CM | POA: Diagnosis not present

## 2023-02-26 DIAGNOSIS — L814 Other melanin hyperpigmentation: Secondary | ICD-10-CM | POA: Diagnosis not present

## 2023-02-26 DIAGNOSIS — L570 Actinic keratosis: Secondary | ICD-10-CM | POA: Diagnosis not present

## 2023-02-26 DIAGNOSIS — L821 Other seborrheic keratosis: Secondary | ICD-10-CM | POA: Diagnosis not present

## 2023-03-15 DIAGNOSIS — D0471 Carcinoma in situ of skin of right lower limb, including hip: Secondary | ICD-10-CM | POA: Diagnosis not present

## 2023-07-02 ENCOUNTER — Other Ambulatory Visit (HOSPITAL_COMMUNITY): Payer: Self-pay | Admitting: Internal Medicine

## 2023-07-02 DIAGNOSIS — R1013 Epigastric pain: Secondary | ICD-10-CM

## 2023-07-03 ENCOUNTER — Ambulatory Visit (HOSPITAL_COMMUNITY)
Admission: RE | Admit: 2023-07-03 | Discharge: 2023-07-03 | Disposition: A | Discharge status: Home / Self Care | Source: Ambulatory Visit | Attending: Internal Medicine | Admitting: Internal Medicine

## 2023-07-03 DIAGNOSIS — R1013 Epigastric pain: Secondary | ICD-10-CM | POA: Insufficient documentation

## 2023-07-03 DIAGNOSIS — K828 Other specified diseases of gallbladder: Secondary | ICD-10-CM | POA: Diagnosis not present
